# Patient Record
Sex: Female | Born: 1970 | Race: Black or African American | Hispanic: No | Marital: Single | Smoking: Never smoker
Health system: Southern US, Community
[De-identification: ages and names within clinical notes are randomized; demographics above are authoritative.]

## PROBLEM LIST (undated history)

## (undated) DIAGNOSIS — IMO0002 Reserved for concepts with insufficient information to code with codable children: Secondary | ICD-10-CM

## (undated) DIAGNOSIS — M329 Systemic lupus erythematosus, unspecified: Secondary | ICD-10-CM

## (undated) DIAGNOSIS — I10 Essential (primary) hypertension: Secondary | ICD-10-CM

## (undated) DIAGNOSIS — E118 Type 2 diabetes mellitus with unspecified complications: Secondary | ICD-10-CM

## (undated) DIAGNOSIS — E119 Type 2 diabetes mellitus without complications: Secondary | ICD-10-CM

## (undated) HISTORY — DX: Type 2 diabetes mellitus with unspecified complications: E11.8

## (undated) HISTORY — PX: NO PAST SURGERIES: SHX2092

---

## 1999-07-06 ENCOUNTER — Other Ambulatory Visit: Admission: RE | Admit: 1999-07-06 | Discharge: 1999-07-06 | Payer: Self-pay | Admitting: Obstetrics and Gynecology

## 2000-08-31 ENCOUNTER — Other Ambulatory Visit: Admission: RE | Admit: 2000-08-31 | Discharge: 2000-08-31 | Payer: Self-pay | Admitting: Obstetrics and Gynecology

## 2001-09-19 ENCOUNTER — Ambulatory Visit (HOSPITAL_COMMUNITY): Admission: RE | Admit: 2001-09-19 | Discharge: 2001-09-19 | Payer: Self-pay | Admitting: Internal Medicine

## 2001-09-20 ENCOUNTER — Ambulatory Visit (HOSPITAL_COMMUNITY): Admission: RE | Admit: 2001-09-20 | Discharge: 2001-09-20 | Payer: Self-pay | Admitting: Internal Medicine

## 2001-09-25 ENCOUNTER — Other Ambulatory Visit: Admission: RE | Admit: 2001-09-25 | Discharge: 2001-09-25 | Payer: Self-pay | Admitting: Obstetrics and Gynecology

## 2003-03-18 ENCOUNTER — Other Ambulatory Visit: Admission: RE | Admit: 2003-03-18 | Discharge: 2003-03-18 | Payer: Self-pay | Admitting: Obstetrics and Gynecology

## 2009-02-12 ENCOUNTER — Encounter: Admission: RE | Admit: 2009-02-12 | Discharge: 2009-02-12 | Payer: Self-pay | Admitting: Specialist

## 2009-04-01 ENCOUNTER — Encounter: Admission: RE | Admit: 2009-04-01 | Discharge: 2009-04-01 | Payer: Self-pay | Admitting: Specialist

## 2010-05-12 ENCOUNTER — Other Ambulatory Visit: Payer: Self-pay | Admitting: Specialist

## 2010-05-12 ENCOUNTER — Ambulatory Visit
Admission: RE | Admit: 2010-05-12 | Discharge: 2010-05-12 | Disposition: A | Discharge status: Home / Self Care | Payer: 59 | Source: Ambulatory Visit | Attending: Specialist | Admitting: Specialist

## 2010-05-12 DIAGNOSIS — R05 Cough: Secondary | ICD-10-CM

## 2010-10-30 ENCOUNTER — Emergency Department (HOSPITAL_BASED_OUTPATIENT_CLINIC_OR_DEPARTMENT_OTHER)
Admission: EM | Admit: 2010-10-30 | Discharge: 2010-10-30 | Disposition: A | Discharge status: Home / Self Care | Payer: 59 | Attending: Emergency Medicine | Admitting: Emergency Medicine

## 2010-10-30 ENCOUNTER — Encounter: Payer: Self-pay | Admitting: Emergency Medicine

## 2010-10-30 ENCOUNTER — Other Ambulatory Visit: Payer: Self-pay

## 2010-10-30 DIAGNOSIS — R42 Dizziness and giddiness: Secondary | ICD-10-CM | POA: Insufficient documentation

## 2010-10-30 DIAGNOSIS — E860 Dehydration: Secondary | ICD-10-CM | POA: Insufficient documentation

## 2010-10-30 DIAGNOSIS — R55 Syncope and collapse: Secondary | ICD-10-CM

## 2010-10-30 HISTORY — DX: Essential (primary) hypertension: I10

## 2010-10-30 HISTORY — DX: Systemic lupus erythematosus, unspecified: M32.9

## 2010-10-30 HISTORY — DX: Reserved for concepts with insufficient information to code with codable children: IMO0002

## 2010-10-30 LAB — URINALYSIS, ROUTINE W REFLEX MICROSCOPIC
Bilirubin Urine: NEGATIVE
Glucose, UA: NEGATIVE mg/dL
Hgb urine dipstick: NEGATIVE
Protein, ur: NEGATIVE mg/dL
Urobilinogen, UA: 1 mg/dL (ref 0.0–1.0)

## 2010-10-30 MED ORDER — SODIUM CHLORIDE 0.9 % IV BOLUS (SEPSIS): 1000.0000 mL | Freq: Once | INTRAVENOUS | Status: DC

## 2010-10-30 NOTE — ED Notes (Signed)
Pt verbalizes care plan well and need for increased hydration

## 2010-10-30 NOTE — ED Provider Notes (Signed)
Scribed for Dayton Bailiff, MD, the patient was seen in room [facility]2/[facility] . This chart was scribed by Ellie Lunch. This patient's care was started at 3:30 PM.   CSN: [id number] Arrival date & time: 10/30/2010  3:45 PM   Chief Complaint  Patient presents with  . Dizziness    pt reports episode of dizziness after 2 glasses of wine No LOC     (Include location/radiation/quality/duration/timing/severity/associated sxs/prior treatment) HPI Andrea [name] is a 40 y.o. female who presents to the Emergency Department complaining of a near syncopal event after a wine tasting event at an outdoor farmers market about 2 hours ago. Pt reports she began to feel light headed (now resolved), sat down. No LOC. Denies HA, N/V/D, chest pain, abd pain, vision change.   HPI ELEMENTS:  Onset: ~ 2 hours ago, sudden Duration: 1 episode  Timing: constant  Context: as above  Modifying factors: Sx improved by change in position- standing to sitting   Past Medical History  Diagnosis Date  . Hypertension   . Anxiety disorder   . Lupus     History reviewed. No pertinent past surgical history.  No family history on file.  History  Substance Use Topics  . Smoking status: Never Smoker   . Smokeless tobacco: Not on file  . Alcohol Use: 1.2 oz/week    2 Glasses of wine per week     Review of Systems 10 Systems reviewed and are negative for acute change except as noted in the HPI.   Allergies  Review of patient's allergies indicates no known allergies.  Home Medications   Current Outpatient Rx  Name Route Sig Dispense Refill  . ALPRAZOLAM 0.25 MG PO TABS Oral Take 0.25 mg by mouth at bedtime as needed.      Marland Kitchen HYDROCHLOROTHIAZIDE 12.5 MG PO CAPS Oral Take 12.5 mg by mouth daily.      Marland Kitchen METFORMIN HCL 500 MG (MOD) PO TB24 Oral Take 500 mg by mouth daily with breakfast.      . METOPROLOL TARTRATE 50 MG PO TABS Oral Take 50 mg by mouth 2 (two) times daily.        Physical Exam    BP 115/66   Pulse 116  Temp(Src) 98.7 F (37.1 C) (Oral)  Resp 24  SpO2 100%  LMP 09/29/2010  Physical Exam  Nursing note and vitals reviewed. Constitutional: She is oriented to person, place, and time. She appears well-developed and well-nourished.  HENT:  Head: Normocephalic and atraumatic.  Eyes: Conjunctivae and EOM are normal. Pupils are equal, round, and reactive to light.  Neck: Normal range of motion. Neck supple.  Cardiovascular: Regular rhythm and normal heart sounds.        Mild tachycardia  Pulmonary/Chest: Effort normal and breath sounds normal.  Abdominal: Soft. Bowel sounds are normal.  Musculoskeletal: Normal range of motion.  Neurological: She is alert and oriented to person, place, and time.  Skin: Skin is warm and dry.  Psychiatric: She has a normal mood and affect.   Procedures OTHER DATA REVIEWED: Nursing notes, vital signs, and past medical records reviewed.  DIAGNOSTIC STUDIES: Oxygen Saturation is 100% on room air, normal by my interpretation.    LABS / RADIOLOGY:  Results for orders placed during the hospital encounter of 10/30/10  URINALYSIS, ROUTINE W REFLEX MICROSCOPIC      Component Value Range   Color, Urine YELLOW  YELLOW    Appearance CLEAR  CLEAR    Specific Gravity, Urine 1.018  1.005 -  1.030    pH 6.0  5.0 - 8.0    Glucose, UA NEGATIVE  NEGATIVE (mg/dL)   Hgb urine dipstick NEGATIVE  NEGATIVE    Bilirubin Urine NEGATIVE  NEGATIVE    Ketones, ur 15 (*) NEGATIVE (mg/dL)   Protein, ur NEGATIVE  NEGATIVE (mg/dL)   Urobilinogen, UA 1.0  0.0 - 1.0 (mg/dL)   Nitrite NEGATIVE  NEGATIVE    Leukocytes, UA NEGATIVE  NEGATIVE     ED COURSE / COORDINATION OF CARE: EDP at PT bedside. Discussed with PT: plan to treat with fluids, run labs, observe Pt for improvement, and discharge.  Orders Placed This Encounter  Procedures  . Urinalysis with microscopic  . Orthostatic vital signs  . EKG test   Medications  sodium chloride 0.9 % bolus 1,000 mL (not  administered)    Date: 10/30/2010  Rate: 101  Rhythm: sinus tachycardia  QRS Axis: normal  Intervals: normal  ST/T Wave abnormalities: normal  Conduction Disutrbances:none  Narrative Interpretation: unremarkable ecg  Old EKG Reviewed: none available  5:53 PM On reassessment the patient has improvement of her symptoms to the resolution. She has negative orthostatic. Her heart rate normalized. At this time patient is safe for discharge.  MDM: The patient's near syncope secondary to dehydration and alcohol consumption in the weather elements. Her urine shows small amount of ketones. She received 1.5 L of IV fluids with resolution of her symptoms. Patient has no signs of orthostasis. I encouraged patient to continue aggressive oral rehydration at home with water and Gatorade. I instructed her to refrain from juices, soft drinks, alcohol. She is provided signs and symptoms for which to return the emergency department. I'm not concerned about cardiac cause of syncope. She instructed to followup with her primary care physician this week as needed  IMPRESSION: Diagnoses that have been ruled out:  Diagnoses that are still under consideration:  Final diagnoses:   SCRIBE ATTESTATION: I personally performed the services described in this documentation, which was scribed in my presence. The recorded information has been reviewed and considered.          Dayton Bailiff, MD 10/30/10 1755

## 2010-10-30 NOTE — ED Notes (Signed)
Pt reports "being overheated" while at Wine tasting reports 1-2 glasses on wine only arrived with IVF infusion in progress

## 2011-08-15 ENCOUNTER — Other Ambulatory Visit: Payer: Self-pay | Admitting: Specialist

## 2011-08-15 ENCOUNTER — Ambulatory Visit
Admission: RE | Admit: 2011-08-15 | Discharge: 2011-08-15 | Disposition: A | Discharge status: Home / Self Care | Payer: 59 | Source: Ambulatory Visit | Attending: Specialist | Admitting: Specialist

## 2011-08-15 DIAGNOSIS — R05 Cough: Secondary | ICD-10-CM

## 2011-12-18 ENCOUNTER — Encounter (HOSPITAL_COMMUNITY): Payer: Self-pay | Admitting: *Deleted

## 2011-12-18 ENCOUNTER — Emergency Department (HOSPITAL_COMMUNITY): Payer: 59

## 2011-12-18 ENCOUNTER — Inpatient Hospital Stay (HOSPITAL_COMMUNITY)
Admission: EM | Admit: 2011-12-18 | Discharge: 2011-12-20 | DRG: 195 | Disposition: A | Discharge status: Home / Self Care | Payer: 59 | Attending: Internal Medicine | Admitting: Internal Medicine

## 2011-12-18 DIAGNOSIS — I10 Essential (primary) hypertension: Secondary | ICD-10-CM | POA: Diagnosis present

## 2011-12-18 DIAGNOSIS — M329 Systemic lupus erythematosus, unspecified: Secondary | ICD-10-CM | POA: Diagnosis present

## 2011-12-18 DIAGNOSIS — E1321 Other specified diabetes mellitus with diabetic nephropathy: Secondary | ICD-10-CM | POA: Diagnosis present

## 2011-12-18 DIAGNOSIS — F411 Generalized anxiety disorder: Secondary | ICD-10-CM | POA: Diagnosis present

## 2011-12-18 DIAGNOSIS — D72829 Elevated white blood cell count, unspecified: Secondary | ICD-10-CM | POA: Diagnosis present

## 2011-12-18 DIAGNOSIS — J189 Pneumonia, unspecified organism: Principal | ICD-10-CM | POA: Diagnosis present

## 2011-12-18 DIAGNOSIS — E119 Type 2 diabetes mellitus without complications: Secondary | ICD-10-CM | POA: Diagnosis present

## 2011-12-18 HISTORY — DX: Type 2 diabetes mellitus without complications: E11.9

## 2011-12-18 LAB — HEMOGLOBIN A1C: Mean Plasma Glucose: 163 mg/dL — ABNORMAL HIGH (ref <117)

## 2011-12-18 LAB — CBC WITH DIFFERENTIAL/PLATELET
Basophils Absolute: 0 10*3/uL (ref 0.0–0.1)
Eosinophils Absolute: 0.1 10*3/uL (ref 0.0–0.7)
Hemoglobin: 13.1 g/dL (ref 12.0–15.0)
MCHC: 33.2 g/dL (ref 30.0–36.0)
Neutro Abs: 8.2 10*3/uL — ABNORMAL HIGH (ref 1.7–7.7)
Neutrophils Relative %: 71 % (ref 43–77)
RDW: 14.8 % (ref 11.5–15.5)

## 2011-12-18 LAB — BASIC METABOLIC PANEL
BUN: 9 mg/dL (ref 6–23)
CO2: 23 mEq/L (ref 19–32)
GFR calc Af Amer: >90 mL/min (ref >90)
Glucose, Bld: 159 mg/dL — ABNORMAL HIGH (ref 70–99)
Sodium: 134 mEq/L — ABNORMAL LOW (ref 135–145)

## 2011-12-18 MED ORDER — SODIUM CHLORIDE 0.9 % IV BOLUS (SEPSIS)
250.0000 mL | Freq: Once | INTRAVENOUS | Status: AC
  Administered 2011-12-18: 250 mL via INTRAVENOUS

## 2011-12-18 MED ORDER — METFORMIN HCL ER 500 MG PO TB24
500.0000 mg | ORAL_TABLET | Freq: Every day | ORAL | Status: DC
  Administered 2011-12-19: 500 mg via ORAL
  Filled 2011-12-18 (×2): qty 1

## 2011-12-18 MED ORDER — IOHEXOL 350 MG/ML SOLN
100.0000 mL | Freq: Once | INTRAVENOUS | Status: AC | PRN
  Administered 2011-12-18: 100 mL via INTRAVENOUS

## 2011-12-18 MED ORDER — ONDANSETRON HCL 4 MG PO TABS: 4.0000 mg | ORAL_TABLET | Freq: Four times a day (QID) | ORAL | Status: DC | PRN

## 2011-12-18 MED ORDER — ENOXAPARIN SODIUM 60 MG/0.6ML ~~LOC~~ SOLN
50.0000 mg | SUBCUTANEOUS | Status: DC
  Administered 2011-12-18 – 2011-12-19 (×2): 50 mg via SUBCUTANEOUS
  Filled 2011-12-18 (×3): qty 0.6

## 2011-12-18 MED ORDER — METHYLPREDNISOLONE SODIUM SUCC 125 MG IJ SOLR
60.0000 mg | Freq: Two times a day (BID) | INTRAMUSCULAR | Status: DC
  Administered 2011-12-18 – 2011-12-19 (×2): 60 mg via INTRAVENOUS
  Filled 2011-12-18 (×2): qty 0.96
  Filled 2011-12-18: qty 2
  Filled 2011-12-18: qty 0.96

## 2011-12-18 MED ORDER — METHYLPREDNISOLONE SODIUM SUCC 125 MG IJ SOLR
125.0000 mg | Freq: Once | INTRAMUSCULAR | Status: AC
  Administered 2011-12-18: 125 mg via INTRAVENOUS
  Filled 2011-12-18: qty 2

## 2011-12-18 MED ORDER — LEVOFLOXACIN IN D5W 750 MG/150ML IV SOLN
750.0000 mg | INTRAVENOUS | Status: DC
  Administered 2011-12-18: 750 mg via INTRAVENOUS
  Filled 2011-12-18: qty 150

## 2011-12-18 MED ORDER — SODIUM CHLORIDE 0.9 % IV SOLN
INTRAVENOUS | Status: DC
  Administered 2011-12-18: 17:00:00 via INTRAVENOUS
  Administered 2011-12-19: 125 mL/h via INTRAVENOUS

## 2011-12-18 MED ORDER — HYDROMORPHONE HCL PF 1 MG/ML IJ SOLN: 1.0000 mg | INTRAMUSCULAR | Status: DC | PRN

## 2011-12-18 MED ORDER — GUAIFENESIN ER 600 MG PO TB12
600.0000 mg | ORAL_TABLET | Freq: Two times a day (BID) | ORAL | Status: DC
  Administered 2011-12-18 – 2011-12-20 (×4): 600 mg via ORAL
  Filled 2011-12-18 (×5): qty 1

## 2011-12-18 MED ORDER — SODIUM CHLORIDE 0.9 % IJ SOLN
3.0000 mL | Freq: Two times a day (BID) | INTRAMUSCULAR | Status: DC
  Administered 2011-12-19: 3 mL via INTRAVENOUS

## 2011-12-18 MED ORDER — SODIUM CHLORIDE 0.9 % IV SOLN: INTRAVENOUS | Status: DC

## 2011-12-18 MED ORDER — SODIUM CHLORIDE 0.9 % IV SOLN
INTRAVENOUS | Status: DC
  Administered 2011-12-18: 16:00:00 via INTRAVENOUS

## 2011-12-18 MED ORDER — ALBUTEROL SULFATE (5 MG/ML) 0.5% IN NEBU: 2.5000 mg | INHALATION_SOLUTION | RESPIRATORY_TRACT | Status: DC | PRN

## 2011-12-18 MED ORDER — ONDANSETRON HCL 4 MG/2ML IJ SOLN: 4.0000 mg | Freq: Four times a day (QID) | INTRAMUSCULAR | Status: DC | PRN

## 2011-12-18 MED ORDER — ALPRAZOLAM 0.25 MG PO TABS: 0.2500 mg | ORAL_TABLET | Freq: Three times a day (TID) | ORAL | Status: DC | PRN

## 2011-12-18 MED ORDER — METOPROLOL TARTRATE 50 MG PO TABS
50.0000 mg | ORAL_TABLET | Freq: Two times a day (BID) | ORAL | Status: DC
  Administered 2011-12-18 – 2011-12-20 (×4): 50 mg via ORAL
  Filled 2011-12-18 (×5): qty 1

## 2011-12-18 MED ORDER — HYDROCODONE-ACETAMINOPHEN 5-325 MG PO TABS
1.0000 | ORAL_TABLET | ORAL | Status: DC | PRN
  Administered 2011-12-19 – 2011-12-20 (×2): 1 via ORAL
  Filled 2011-12-18 (×2): qty 1

## 2011-12-18 NOTE — Progress Notes (Signed)
ANTIBIOTIC CONSULT NOTE - INITIAL  Pharmacy Consult for Levaquin Indication: rule out pneumonia  No Known Allergies  Patient Measurements: Height: 5\' 6"  (167.6 cm) Weight: 245 lb (111.131 kg) IBW/kg (Calculated) : 59.3   Vital Signs: Temp: 99.3 F (37.4 C) (11/03 1507) Temp src: Oral (11/03 1507) BP: 136/99 mmHg (11/03 1500) Pulse Rate: 116  (11/03 1500) Intake/Output from previous day:    Labs:  [facility] 12/18/11 1132  WBC 11.6*  HGB 13.1  PLT 230  LABCREA --  CREATININE 0.69   Estimated Creatinine Clearance: 114.5 ml/min (by C-G formula based on Cr of 0.69).   Microbiology: No results found for this or any previous visit (from the past 720 hour(s)).  Medical History: Past Medical History  Diagnosis Date  . Hypertension   . Anxiety disorder   . Lupus   . Diabetes mellitus without complication     Medications:  Anti-infectives    None     Assessment:  41 YOF prevents to ED 11/3 with c/o SOB. Hx of lupus, states has been having bronchitis for 5-6 months, received flu and pneumo vaccines recently.  CrCl (n) > 100 ml/min  Afebrile in ED, WBC elevated at 11.6  Goal of Therapy:  Appropriate renal dosing of levaquin, resolution of infection  Plan:   Levaquin 750mg  IV q24h.  Follow up renal fxn and culture results.   Lynann Beaver PharmD, BCPS Pager [phone] 12/18/2011 4:50 PM

## 2011-12-18 NOTE — ED Notes (Signed)
Patient transported to CT 

## 2011-12-18 NOTE — H&P (Signed)
[facility] History and Physical  Andrea [name] ION:[id number] DOB: [birth year] DOA: 12/18/2011  Referring physician: ER physician PCP: No primary provider on file.   Chief Complaint: shortness of breath  HPI:  41 year old female with past medical history of SLE, depression, HTN, DM who presented with worsening shortness of breath for past few days prior to this admission. Patient does report having chronic bronchitis and cough however this time she has had shortness of breath and feeling weak. She has been seen in the lupus clinic in [facility] 2 days prior were she was given a flu shot and recommended to taper down her prednisone to 10 mg twice daily. Patient reports associated subjective fever but no chest pain, no palpitations. She does have occasional lightheadedness but no loss of consciousness. In ED patient was found to be hypoxic and with leukocytosis.  Assessment and Plan:  Principal Problem:  *CAP (community acquired pneumonia)  Pneumonia order set in place  Will follow up on labs associated with pneumonia order set (blood and resp cultures, legionella and strep pneumo)  Start Levaquin IV daily  Nebulizer treatments as needed  Oxygen support via nasal canula to keep O2 saturation above 90%  Active Problems: Shortness of breath  Obvious concern in the setting of desaturation is likelihood of pulmonary embolism  Patient had CT angio chest which was negative for PE but findings included interstitial changes consistent with the patient's history of lupus  Leukocytosis  Secondary to combination of steroids and community acquired pneumonia  Management as above with Levaquin  HTN (hypertension)  Continue metoprolol  Diabetes mellitus  Continue metformin  Check A1c  Lupus  Will switch to Iv solumedrol and D/C home prednisone PO dosage Anxiety  Ativan TID PRN per home regimen   Code status: full code Family education: updated at  bedside Disposition: home when stable   Manson Passey [facility] [phone]  Antibiotics:  Levaquin 12/18/2011 -->  Review of Systems:  Constitutional: Negative for fever, chills and malaise/fatigue. Negative for diaphoresis.  HENT: Negative for hearing loss, ear pain, nosebleeds, congestion, sore throat, neck pain, tinnitus and ear discharge.   Eyes: Negative for blurred vision, double vision, photophobia, pain, discharge and redness.  Respiratory: Negative for cough, hemoptysis, sputum production, positive for shortness of breath, no wheezing and stridor.   Cardiovascular: Negative for chest pain, palpitations, orthopnea, claudication and leg swelling.  Gastrointestinal: Negative for nausea, vomiting and abdominal pain. Negative for heartburn, constipation, blood in stool and melena.  Genitourinary: Negative for dysuria, urgency, frequency, hematuria and flank pain.  Musculoskeletal: Negative for myalgias, back pain, joint pain and falls.  Skin: Negative for itching and rash.  Neurological: Negative for dizziness and weakness. Negative for tingling, tremors, sensory change, speech change, focal weakness, loss of consciousness and headaches.  Endo/Heme/Allergies: Negative for environmental allergies and polydipsia. Does not bruise/bleed easily.  Psychiatric/Behavioral: Negative for suicidal ideas. The patient is not nervous/anxious.      Past Medical History  Diagnosis Date  . Hypertension   . Anxiety disorder   . Lupus   . Diabetes mellitus without complication    History reviewed. No pertinent past surgical history. Social History:  reports that she has never smoked. She does not have any smokeless tobacco history on file. She reports that she drinks about 1.2 ounces of alcohol per week. She reports that she does not use illicit drugs.  No Known Allergies  Family History: HTN in mother  Prior to Admission medications   Medication Sig  Start Date End Date Taking? Authorizing Provider   ALPRAZolam (XANAX) 0.25 MG tablet Take 0.25 mg by mouth 3 (three) times daily as needed. anxiety   Yes Historical Provider, MD  hydroxychloroquine (PLAQUENIL) 200 MG tablet Take by mouth daily.   Yes Historical Provider, MD  metFORMIN (GLUMETZA) 500 MG (MOD) 24 hr tablet Take 500 mg by mouth daily with breakfast.     Yes Historical Provider, MD  metoprolol (LOPRESSOR) 50 MG tablet Take 50 mg by mouth 2 (two) times daily.     Yes Historical Provider, MD  predniSONE (DELTASONE) 10 MG tablet Take 10-20 mg by mouth 2 (two) times daily. Take 2 tablets in the morning and 1 tablet in the pm   Yes Historical Provider, MD   Physical Exam: Filed Vitals:   12/18/11 1433 12/18/11 1500 12/18/11 1507 12/18/11 1652  BP: 109/86 136/99  129/90  Pulse: 122 116  112  Temp:   99.3 F (37.4 C) 98.4 F (36.9 C)  TempSrc:   Oral Oral  Resp:  31  26  Height:      Weight:      SpO2: 99% 99%  98%    Physical Exam  Constitutional: Appears well-developed and well-nourished. No distress.  HENT: Normocephalic. External right and left ear normal. Oropharynx is clear and moist.  Eyes: Conjunctivae and EOM are normal. PERRLA, no scleral icterus.  Neck: Normal ROM. Neck supple. No JVD. No tracheal deviation. No thyromegaly.  CVS: RRR, S1/S2 +, no murmurs, no gallops, no carotid bruit.  Pulmonary: diminished breath sounds at bases, crackles appreciated at bases.  Abdominal: Soft. BS +,  no distension, tenderness, rebound or guarding.  Musculoskeletal: Normal range of motion. No edema and no tenderness.  Lymphadenopathy: No lymphadenopathy noted, cervical, inguinal. Neuro: Alert. Normal reflexes, muscle tone coordination. No cranial nerve deficit. Skin: Skin is warm and dry. No rash noted. Not diaphoretic. No erythema. No pallor.  Psychiatric: Normal mood and affect. Behavior, judgment, thought content normal.   Labs on Admission:  Basic Metabolic Panel:  Lab 12/18/11 1610  NA 134*  K 3.6  CL 99  CO2 23   GLUCOSE 159*  BUN 9  CREATININE 0.69  CALCIUM 9.4   CBC:  Lab 12/18/11 1132  WBC 11.6*  HGB 13.1  HCT 39.4  MCV 81.6  PLT 230   Radiological Exams on Admission: Dg Chest 2 View 12/18/2011  * IMPRESSION:  1.  Bibasilar atelectasis. 2.  Bronchitic changes.     Ct Angio Chest W/cm &/or Wo Cm 12/18/2011  *  IMPRESSION: Negative for significant acute pulmonary embolus by CTA within the central and proximal hilar vasculature.  Scattered patchy bilateral interstitial opacities, suspect chronic interstitial lung disease from connective tissue disease such as a lupus.       EKG: Normal sinus rhythm, no ST/T wave changes  Time spent: 75 minutes

## 2011-12-18 NOTE — ED Notes (Signed)
Pt has hx of lupus states has been having bronchitis for 5-6 months. Pt went to lupus clinic on Friday and received the flu shot. Pt states that SOB has worsened today. Pt also c/o HA.

## 2011-12-18 NOTE — ED Provider Notes (Signed)
History     CSN: [id number]  Arrival date & time 12/18/11  1059   First MD Initiated Contact with Patient 12/18/11 1140      Chief Complaint  Patient presents with  . Shortness of Breath    (Consider location/radiation/quality/duration/timing/severity/associated sxs/prior treatment) Patient is a 41 y.o. female presenting with shortness of breath. The history is provided by the patient and a relative.  Shortness of Breath  Associated symptoms include shortness of breath. Pertinent negatives include no chest pain, no fever and no wheezing.   patient with a history of lupus has been having trouble with chronic bronchitis for the past 5-6 months. Was seen at [facility] Lupus clinic on Friday. They decreased her prednisone to 10 mg twice a day. She also got a flu shot and pneumonia shot on that day. Starting that evening she started to feel bad increase shortness of breath got worse over the weekend. Patient now with walking gets lightheaded and feels very short of breath.  Past Medical History  Diagnosis Date  . Hypertension   . Anxiety disorder   . Lupus   . Diabetes mellitus without complication     History reviewed. No pertinent past surgical history.  History reviewed. No pertinent family history.  History  Substance Use Topics  . Smoking status: Never Smoker   . Smokeless tobacco: Not on file  . Alcohol Use: 1.2 oz/week    2 Glasses of wine per week    OB History    Grav Para Term Preterm Abortions TAB SAB Ect Mult Living                  Review of Systems  Constitutional: Negative for fever.  HENT: Negative for congestion.   Eyes: Negative for visual disturbance.  Respiratory: Positive for shortness of breath. Negative for wheezing.   Cardiovascular: Negative for chest pain.  Gastrointestinal: Negative for abdominal pain.  Genitourinary: Negative for dysuria.  Musculoskeletal: Negative for back pain.  Skin: Negative for rash.  Neurological: Positive for  light-headedness.    Allergies  Review of patient's allergies indicates no known allergies.  Home Medications   Current Outpatient Rx  Name  Route  Sig  Dispense  Refill  . ALPRAZOLAM 0.25 MG PO TABS   Oral   Take 0.25 mg by mouth 3 (three) times daily as needed. anxiety         . HYDROXYCHLOROQUINE SULFATE 200 MG PO TABS   Oral   Take by mouth daily.         Marland Kitchen METFORMIN HCL ER (MOD) 500 MG PO TB24   Oral   Take 500 mg by mouth daily with breakfast.           . METOPROLOL TARTRATE 50 MG PO TABS   Oral   Take 50 mg by mouth 2 (two) times daily.           Marland Kitchen PREDNISONE 10 MG PO TABS   Oral   Take 10-20 mg by mouth 2 (two) times daily. Take 2 tablets in the morning and 1 tablet in the pm           BP 136/99  Pulse 116  Temp 99.3 F (37.4 C) (Oral)  Resp 31  Ht 5\' 6"  (1.676 m)  Wt 245 lb (111.131 kg)  BMI 39.54 kg/m2  SpO2 99%  Physical Exam  Nursing note and vitals reviewed. Constitutional: She is oriented to person, place, and time. She appears well-developed and well-nourished. No  distress.  HENT:  Head: Normocephalic and atraumatic.  Mouth/Throat: Oropharynx is clear and moist.  Eyes: Conjunctivae normal and EOM are normal. Pupils are equal, round, and reactive to light.  Neck: Normal range of motion. Neck supple.  Cardiovascular: Normal rate, regular rhythm and normal heart sounds.   Pulmonary/Chest: Effort normal and breath sounds normal. No respiratory distress. She has no wheezes. She has no rales.  Abdominal: Soft. Bowel sounds are normal. There is no tenderness.  Musculoskeletal: Normal range of motion.  Neurological: She is alert and oriented to person, place, and time. No cranial nerve deficit. She exhibits normal muscle tone. Coordination normal.  Skin: Skin is warm. No rash noted.    ED Course  Procedures (including critical care time)  Labs Reviewed  CBC WITH DIFFERENTIAL - Abnormal; Notable for the following:    WBC 11.6 (*)      Neutro Abs 8.2 (*)     All other components within normal limits  BASIC METABOLIC PANEL - Abnormal; Notable for the following:    Sodium 134 (*)     Glucose, Bld 159 (*)     All other components within normal limits   Dg Chest 2 View  12/18/2011  *RADIOLOGY REPORT*  Clinical Data: Shortness of breath and cough  CHEST - 2 VIEW  Comparison: 08/15/2011  Findings: There is mild cardiac enlargement.  Lung volumes are low and there is asymmetric elevation of the right hemidiaphragm.  Moderate bronchitic changes are noted with atelectasis in both lung bases.  No edema or effusions.  No airspace consolidation.  IMPRESSION:  1.  Bibasilar atelectasis. 2.  Bronchitic changes.   Original Report Authenticated By: Signa Kell, M.D.    Ct Angio Chest W/cm &/or Wo Cm  12/18/2011  *RADIOLOGY REPORT*  Clinical Data: Shortness of breath, history of lupus and chronic bronchitis  CT ANGIOGRAPHY CHEST  Technique:  Multidetector CT imaging of the chest using the standard protocol during bolus administration of intravenous contrast. Multiplanar reconstructed images including MIPs were obtained and reviewed to evaluate the vascular anatomy.  Contrast: OMNIPAQUE IOHEXOL 350 MG/ML SOLN  Comparison: 08/17/2011 chest x-ray  Findings: No significant central or proximal hilar filling defect or pulmonary embolus by CTA.  Limited assessment of the peripheral segmental and subsegmental branching vasculature for small emboli. Normal heart size.  No pericardial or pleural effusion.  No hiatal hernia.  Mildly prominent mediastinal lymph nodes without definite adenopathy.  Upper abdominal imaging demonstrates no acute process.  Lung windows demonstrate patchy scattered peripheral chronic appearing interstitial opacities in the upper lobes anteriorly but also in the lingula, right middle lobe, and both lower lobes. Within the right middle lobe and both lower lobes, there are central air bronchograms.  The pattern and appearance is  compatible with chronic interstitial lung disease which can be seen with lupus.  Difficult to exclude underlying acute pulmonary hemorrhage or infection.  No associated bronchiectasis, mucus plugging, bronchial wall thickening.  Trachea central airways appear patent.  No osseous abnormality.  IMPRESSION: Negative for significant acute pulmonary embolus by CTA within the central and proximal hilar vasculature.  Scattered patchy bilateral interstitial opacities, suspect chronic interstitial lung disease from connective tissue disease such as a lupus.  See above comment.   Original Report Authenticated By: Judie Petit. Miles Costain, M.D.    Results for orders placed during the hospital encounter of 12/18/11  CBC WITH DIFFERENTIAL      Component Value Range   WBC 11.6 (*) 4.0 - 10.5 K/uL  RBC 4.83  3.87 - 5.11 MIL/uL   Hemoglobin 13.1  12.0 - 15.0 g/dL   HCT 16.1  09.6 - 04.5 %   MCV 81.6  78.0 - 100.0 fL   MCH 27.1  26.0 - 34.0 pg   MCHC 33.2  30.0 - 36.0 g/dL   RDW 40.9  81.1 - 91.4 %   Platelets 230  150 - 400 K/uL   Neutrophils Relative 71  43 - 77 %   Neutro Abs 8.2 (*) 1.7 - 7.7 K/uL   Lymphocytes Relative 21  12 - 46 %   Lymphs Abs 2.4  0.7 - 4.0 K/uL   Monocytes Relative 8  3 - 12 %   Monocytes Absolute 0.9  0.1 - 1.0 K/uL   Eosinophils Relative 1  0 - 5 %   Eosinophils Absolute 0.1  0.0 - 0.7 K/uL   Basophils Relative 0  0 - 1 %   Basophils Absolute 0.0  0.0 - 0.1 K/uL  BASIC METABOLIC PANEL      Component Value Range   Sodium 134 (*) 135 - 145 mEq/L   Potassium 3.6  3.5 - 5.1 mEq/L   Chloride 99  96 - 112 mEq/L   CO2 23  19 - 32 mEq/L   Glucose, Bld 159 (*) 70 - 99 mg/dL   BUN 9  6 - 23 mg/dL   Creatinine, Ser 7.82  0.50 - 1.10 mg/dL   Calcium 9.4  8.4 - 95.6 mg/dL   GFR calc non Af Amer >90  >90 mL/min   GFR calc Af Amer >90  >90 mL/min     1. Lupus (systemic lupus erythematosus)       MDM  Patient with known lupus presented with worsening shortness of breath since Friday evening.  Workup seems to be consistent with an exacerbation of her lupus. No evidence of pneumonia PE or pneumothorax. No significant leukocytosis. Patient is chronically on prednisone recently decreased on Friday. Patient here when she exerted self with desats below 90% room air saturation. Patient also feel lightheaded and the shortness of breath would increase. At rest room air saturations been 95-96%. The patient feels better on 2 L of oxygen. Patient received R. 25 mg site Medrol here in the emergency department.  Discussed with [facility] they will admit to telemetry team 1 temporary middle orders have been completed. To include their request of Solu-Medrol 60 mg every 12 hours.        Shelda Jakes, MD 12/18/11 1534

## 2011-12-18 NOTE — ED Notes (Signed)
Pt wheeled to BR without 02, upon arrival to ER rm 6, 02 sats 83% on RA and pts HR 132. 02 at 2L placed on pt and sats increased to 100%. HR down to 123.

## 2011-12-18 NOTE — ED Notes (Signed)
MD at bedside. 

## 2011-12-18 NOTE — ED Notes (Signed)
Took pt a sandwich, chips, and a cup of water

## 2011-12-19 LAB — CBC
HCT: 36.7 % (ref 36.0–46.0)
Hemoglobin: 11.9 g/dL — ABNORMAL LOW (ref 12.0–15.0)
MCHC: 32.4 g/dL (ref 30.0–36.0)
MCV: 82.1 fL (ref 78.0–100.0)
WBC: 9.5 10*3/uL (ref 4.0–10.5)

## 2011-12-19 LAB — HIV ANTIBODY (ROUTINE TESTING W REFLEX): HIV: NONREACTIVE

## 2011-12-19 LAB — GLUCOSE, CAPILLARY
Glucose-Capillary: 218 mg/dL — ABNORMAL HIGH (ref 70–99)
Glucose-Capillary: 180 mg/dL — ABNORMAL HIGH (ref 70–99)
Glucose-Capillary: 137 mg/dL — ABNORMAL HIGH (ref 70–99)

## 2011-12-19 LAB — BASIC METABOLIC PANEL
BUN: 10 mg/dL (ref 6–23)
CO2: 22 mEq/L (ref 19–32)
Chloride: 103 mEq/L (ref 96–112)
GFR calc Af Amer: >90 mL/min (ref >90)
Potassium: 4.5 mEq/L (ref 3.5–5.1)

## 2011-12-19 MED ORDER — HYDROXYCHLOROQUINE SULFATE 200 MG PO TABS
200.0000 mg | ORAL_TABLET | Freq: Every day | ORAL | Status: DC
  Administered 2011-12-19 – 2011-12-20 (×2): 200 mg via ORAL
  Filled 2011-12-19 (×2): qty 1

## 2011-12-19 MED ORDER — LEVOFLOXACIN 750 MG PO TABS
750.0000 mg | ORAL_TABLET | Freq: Every day | ORAL | Status: DC
  Administered 2011-12-19 – 2011-12-20 (×2): 750 mg via ORAL
  Filled 2011-12-19 (×2): qty 1

## 2011-12-19 MED ORDER — GUAIFENESIN-DM 100-10 MG/5ML PO SYRP
5.0000 mL | ORAL_SOLUTION | ORAL | Status: DC | PRN
Start: 2011-12-19 — End: 2011-12-20

## 2011-12-19 MED ORDER — INSULIN ASPART 100 UNIT/ML ~~LOC~~ SOLN
0.0000 [IU] | Freq: Every day | SUBCUTANEOUS | Status: DC
  Administered 2011-12-19: 2 [IU] via SUBCUTANEOUS

## 2011-12-19 MED ORDER — INSULIN ASPART 100 UNIT/ML ~~LOC~~ SOLN
0.0000 [IU] | Freq: Three times a day (TID) | SUBCUTANEOUS | Status: DC
  Administered 2011-12-19: 1 [IU] via SUBCUTANEOUS
  Administered 2011-12-19: 9 [IU] via SUBCUTANEOUS
  Administered 2011-12-20: 2 [IU] via SUBCUTANEOUS
  Administered 2011-12-20: 1 [IU] via SUBCUTANEOUS

## 2011-12-19 MED ORDER — PREDNISONE 20 MG PO TABS
20.0000 mg | ORAL_TABLET | Freq: Every day | ORAL | Status: DC
  Administered 2011-12-20: 20 mg via ORAL
  Filled 2011-12-19 (×2): qty 1

## 2011-12-19 MED ORDER — METFORMIN HCL ER 500 MG PO TB24
1000.0000 mg | ORAL_TABLET | Freq: Every day | ORAL | Status: DC
  Filled 2011-12-19: qty 2

## 2011-12-19 MED ORDER — PREDNISONE 10 MG PO TABS
10.0000 mg | ORAL_TABLET | Freq: Every day | ORAL | Status: DC
  Administered 2011-12-19: 10 mg via ORAL
  Filled 2011-12-19 (×2): qty 1

## 2011-12-19 NOTE — Progress Notes (Signed)
Patient wearing oxygen when she feels short of breath.  Otherwise, her pulse ox on room air is 95%.  Will continue to monitor.

## 2011-12-19 NOTE — Progress Notes (Signed)
Subjective: Feeling slightly better today but still coughing.  No other specific concerns.  Objective: Vital signs in last 24 hours: Filed Vitals:   12/18/11 1507 12/18/11 1652 12/18/11 2200 12/19/11 0600  BP:  129/90 135/83 120/84  Pulse:  112 110 86  Temp: 99.3 F (37.4 C) 98.4 F (36.9 C) 99.5 F (37.5 C) 97.5 F (36.4 C)  TempSrc: Oral Oral Oral Oral  Resp:  26 24 20   Height:      Weight:      SpO2:  98% 98% 100%   Weight change:   Intake/Output Summary (Last 24 hours) at 12/19/11 1116 Last data filed at 12/19/11 0500  Gross per 24 hour  Intake   1615 ml  Output      0 ml  Net   1615 ml    Physical Exam: General: Awake, Oriented, No acute distress. HEENT: EOMI. Neck: Supple CV: S1 and S2 Lungs: Clear to ascultation bilaterally Abdomen: Soft, Nontender, Nondistended, +bowel sounds. Ext: Good pulses. Trace edema.  Lab Results: Basic Metabolic Panel:  Lab 12/19/11 4098 12/18/11 1132  NA 132* 134*  K 4.5 3.6  CL 103 99  CO2 22 23  GLUCOSE 280* 159*  BUN 10 9  CREATININE 0.62 0.69  CALCIUM 8.9 9.4  MG -- --  PHOS -- --   Liver Function Tests: No results found for this basename: AST:5,ALT:5,ALKPHOS:5,BILITOT:5,PROT:5,ALBUMIN:5 in the last 168 hours No results found for this basename: LIPASE:5,AMYLASE:5 in the last 168 hours No results found for this basename: AMMONIA:5 in the last 168 hours CBC:  Lab 12/19/11 0512 12/18/11 1132  WBC 9.5 11.6*  NEUTROABS -- 8.2*  HGB 11.9* 13.1  HCT 36.7 39.4  MCV 82.1 81.6  PLT 228 230   Cardiac Enzymes: No results found for this basename: CKTOTAL:5,CKMB:5,CKMBINDEX:5,TROPONINI:5 in the last 168 hours BNP (last 3 results) No results found for this basename: PROBNP:3 in the last 8760 hours CBG: No results found for this basename: GLUCAP:5 in the last 168 hours  Basename 12/18/11 1132  HGBA1C 7.3*   Other Labs: No components found with this basename: POCBNP:3 No results found for this basename: DDIMER:2 in  the last 168 hours No results found for this basename: CHOL:2,HDL:2,LDLCALC:2,TRIG:2,CHOLHDL:2,LDLDIRECT:2 in the last 168 hours No results found for this basename: TSH,T4TOTAL,FREET3,T3FREE,FREET4,THYROIDAB in the last 168 hours No results found for this basename: VITAMINB12:2,FOLATE:2,FERRITIN:2,TIBC:2,IRON:2,RETICCTPCT:2 in the last 168 hours  Micro Results: No results found for this or any previous visit (from the past 240 hour(s)).  Studies/Results: Dg Chest 2 View  12/18/2011  *RADIOLOGY REPORT*  Clinical Data: Shortness of breath and cough  CHEST - 2 VIEW  Comparison: 08/15/2011  Findings: There is mild cardiac enlargement.  Lung volumes are low and there is asymmetric elevation of the right hemidiaphragm.  Moderate bronchitic changes are noted with atelectasis in both lung bases.  No edema or effusions.  No airspace consolidation.  IMPRESSION:  1.  Bibasilar atelectasis. 2.  Bronchitic changes.   Original Report Authenticated By: Signa Kell, M.D.    Ct Angio Chest W/cm &/or Wo Cm  12/18/2011  *RADIOLOGY REPORT*  Clinical Data: Shortness of breath, history of lupus and chronic bronchitis  CT ANGIOGRAPHY CHEST  Technique:  Multidetector CT imaging of the chest using the standard protocol during bolus administration of intravenous contrast. Multiplanar reconstructed images including MIPs were obtained and reviewed to evaluate the vascular anatomy.  Contrast: OMNIPAQUE IOHEXOL 350 MG/ML SOLN  Comparison: 08/17/2011 chest x-ray  Findings: No significant central or proximal  hilar filling defect or pulmonary embolus by CTA.  Limited assessment of the peripheral segmental and subsegmental branching vasculature for small emboli. Normal heart size.  No pericardial or pleural effusion.  No hiatal hernia.  Mildly prominent mediastinal lymph nodes without definite adenopathy.  Upper abdominal imaging demonstrates no acute process.  Lung windows demonstrate patchy scattered peripheral chronic  appearing interstitial opacities in the upper lobes anteriorly but also in the lingula, right middle lobe, and both lower lobes. Within the right middle lobe and both lower lobes, there are central air bronchograms.  The pattern and appearance is compatible with chronic interstitial lung disease which can be seen with lupus.  Difficult to exclude underlying acute pulmonary hemorrhage or infection.  No associated bronchiectasis, mucus plugging, bronchial wall thickening.  Trachea central airways appear patent.  No osseous abnormality.  IMPRESSION: Negative for significant acute pulmonary embolus by CTA within the central and proximal hilar vasculature.  Scattered patchy bilateral interstitial opacities, suspect chronic interstitial lung disease from connective tissue disease such as a lupus.  See above comment.   Original Report Authenticated By: Judie Petit. Miles Costain, M.D.     Medications: I have reviewed the patient's current medications. Scheduled Meds:   . enoxaparin (LOVENOX) injection  50 mg Subcutaneous Q24H  . guaiFENesin  600 mg Oral BID  . hydroxychloroquine  200 mg Oral Daily  . insulin aspart  0-5 Units Subcutaneous QHS  . insulin aspart  0-9 Units Subcutaneous TID WC  . levofloxacin  750 mg Oral Daily  . metFORMIN  500 mg Oral Q breakfast  . [COMPLETED] methylPREDNISolone (SOLU-MEDROL) injection  125 mg Intravenous Once  . metoprolol  50 mg Oral BID  . predniSONE  10-20 mg Oral BID  . [COMPLETED] sodium chloride  250 mL Intravenous Once  . sodium chloride  3 mL Intravenous Q12H  . [DISCONTINUED] sodium chloride   Intravenous STAT  . [DISCONTINUED] levofloxacin (LEVAQUIN) IV  750 mg Intravenous Q24H  . [DISCONTINUED] methylPREDNISolone (SOLU-MEDROL) injection  60 mg Intravenous Q12H   Continuous Infusions:   . [DISCONTINUED] sodium chloride 100 mL/hr at 12/18/11 1556  . [DISCONTINUED] sodium chloride 125 mL/hr (12/19/11 0900)   PRN Meds:.albuterol, ALPRAZolam, HYDROcodone-acetaminophen,  HYDROmorphone (DILAUDID) injection, [COMPLETED] iohexol, ondansetron (ZOFRAN) IV, ondansetron  Assessment/Plan: Acute respiratory distress due to community acquired pneumonia CT of the chest on 11/17/2011 with contrast was negative for acute pulmonary embolism, it did show scattered patchy bilateral interstitial opacities, suspect chronic interstitial lung disease from connective tissue disease such as lupus.  Blood cultures x2 on 12/18/2011 show no growth to date.  Urine legionella pending.  HIV negative.  Strep pneumo negative.  Transition IV levofloxacin to oral levofloxacin.  Defined 5-7 day course of antibiotics. Nebulizer treatments as needed. Oxygen support via nasal canula to keep O2 saturation above 90%.  Suspect patient's cough may be related to chronic interstitial lung disease and may benefit from outpatient pulmonary evaluation.  Continue incentive spirometry.  Shortness of breath  Patient had CT angio chest which was negative for PE but findings included interstitial changes consistent with the patient's history of lupus. May benefit from outpatient pulmonary evaluation.  Leukocytosis  Secondary to combination of steroids and community acquired pneumonia. Resolved.   HTN (hypertension)  Stable. Continue metoprolol.  Diabetes mellitus  Continue metformin.  Hemoglobin A1c on 12/18/2011 17.3 suggesting an average blood 163.  Increase metformin from 500 to thousand milligrams daily.  Further titration to be done as outpatient.  Lupus  Restart it patient on home prednisone  dose.  Anxiety  Ativan TID PRN per home regimen.  Code status: full code  Family education: Updated patient and family at bedside.   Disposition: Home when stable     LOS: 1 day  Andrea [name] A, MD 12/19/2011, 11:16 AM

## 2011-12-20 LAB — BASIC METABOLIC PANEL
CO2: 24 mEq/L (ref 19–32)
Chloride: 104 mEq/L (ref 96–112)
Glucose, Bld: 143 mg/dL — ABNORMAL HIGH (ref 70–99)
Potassium: 3.9 mEq/L (ref 3.5–5.1)
Sodium: 135 mEq/L (ref 135–145)

## 2011-12-20 LAB — CBC
HCT: 38 % (ref 36.0–46.0)
Hemoglobin: 12.3 g/dL (ref 12.0–15.0)
RBC: 4.53 MIL/uL (ref 3.87–5.11)

## 2011-12-20 LAB — LEGIONELLA ANTIGEN, URINE: Legionella Antigen, Urine: NEGATIVE

## 2011-12-20 MED ORDER — UNABLE TO FIND: Status: DC

## 2011-12-20 MED ORDER — LEVOFLOXACIN 750 MG PO TABS: 750.0000 mg | ORAL_TABLET | Freq: Every day | ORAL | Status: DC

## 2011-12-20 MED ORDER — METFORMIN HCL ER (OSM) 1000 MG PO TB24: 1000.0000 mg | ORAL_TABLET | Freq: Every day | ORAL | Status: AC

## 2011-12-20 MED ORDER — BENZONATATE 100 MG PO CAPS
100.0000 mg | ORAL_CAPSULE | Freq: Three times a day (TID) | ORAL | Status: DC | PRN
  Filled 2011-12-20: qty 1

## 2011-12-20 MED ORDER — GUAIFENESIN-DM 100-10 MG/5ML PO SYRP: 5.0000 mL | ORAL_SOLUTION | ORAL | Status: DC | PRN

## 2011-12-20 MED ORDER — BENZONATATE 100 MG PO CAPS: 100.0000 mg | ORAL_CAPSULE | Freq: Three times a day (TID) | ORAL | Status: DC | PRN

## 2011-12-20 MED ORDER — LIVING WELL WITH DIABETES BOOK
Freq: Once | Status: AC
  Administered 2011-12-20: 10:00:00
  Filled 2011-12-20: qty 1

## 2011-12-20 MED ORDER — GUAIFENESIN ER 600 MG PO TB12: 600.0000 mg | ORAL_TABLET | Freq: Two times a day (BID) | ORAL | Status: DC

## 2011-12-20 NOTE — Progress Notes (Signed)
Subjective: Breathing better, but still coughing. No other specific concerns.  Objective: Vital signs in last 24 hours: Filed Vitals:   12/19/11 1555 12/19/11 2200 12/19/11 2228 12/20/11 0600  BP: 135/91 119/81 119/81 122/86  Pulse: 98 99 99 85  Temp: 98.1 F (36.7 C) 98.6 F (37 C)  98.3 F (36.8 C)  TempSrc: Oral Oral  Oral  Resp: 20 20  18   Height:      Weight:      SpO2: 99% 99%  99%   Weight change:   Intake/Output Summary (Last 24 hours) at 12/20/11 1027 Last data filed at 12/20/11 4540  Gross per 24 hour  Intake    360 ml  Output      0 ml  Net    360 ml    Physical Exam: General: Awake, Oriented, No acute distress. HEENT: EOMI. Neck: Supple CV: S1 and S2 Lungs: Clear to ascultation bilaterally Abdomen: Soft, Nontender, Nondistended, +bowel sounds. Ext: Good pulses. Trace edema.  Lab Results: Basic Metabolic Panel:  Lab 12/20/11 9811 12/19/11 0512 12/18/11 1132  NA 135 132* 134*  K 3.9 4.5 3.6  CL 104 103 99  CO2 24 22 23   GLUCOSE 143* 280* 159*  BUN 14 10 9   CREATININE 0.73 0.62 0.69  CALCIUM 9.3 8.9 9.4  MG -- -- --  PHOS -- -- --   Liver Function Tests: No results found for this basename: AST:5,ALT:5,ALKPHOS:5,BILITOT:5,PROT:5,ALBUMIN:5 in the last 168 hours No results found for this basename: LIPASE:5,AMYLASE:5 in the last 168 hours No results found for this basename: AMMONIA:5 in the last 168 hours CBC:  Lab 12/20/11 0530 12/19/11 0512 12/18/11 1132  WBC 17.1* 9.5 11.6*  NEUTROABS -- -- 8.2*  HGB 12.3 11.9* 13.1  HCT 38.0 36.7 39.4  MCV 83.9 82.1 81.6  PLT 233 228 230   Cardiac Enzymes: No results found for this basename: CKTOTAL:5,CKMB:5,CKMBINDEX:5,TROPONINI:5 in the last 168 hours BNP (last 3 results) No results found for this basename: PROBNP:3 in the last 8760 hours CBG:  Lab 12/20/11 0736 12/19/11 2142 12/19/11 1846 12/19/11 1646 12/19/11 1248  GLUCAP 128* 213* 137* 180* 381*    Basename 12/18/11 1132  HGBA1C 7.3*    Other Labs: No components found with this basename: POCBNP:3 No results found for this basename: DDIMER:2 in the last 168 hours No results found for this basename: CHOL:2,HDL:2,LDLCALC:2,TRIG:2,CHOLHDL:2,LDLDIRECT:2 in the last 168 hours No results found for this basename: TSH,T4TOTAL,FREET3,T3FREE,FREET4,THYROIDAB in the last 168 hours No results found for this basename: VITAMINB12:2,FOLATE:2,FERRITIN:2,TIBC:2,IRON:2,RETICCTPCT:2 in the last 168 hours  Micro Results: No results found for this or any previous visit (from the past 240 hour(s)).  Studies/Results: Dg Chest 2 View  12/18/2011  *RADIOLOGY REPORT*  Clinical Data: Shortness of breath and cough  CHEST - 2 VIEW  Comparison: 08/15/2011  Findings: There is mild cardiac enlargement.  Lung volumes are low and there is asymmetric elevation of the right hemidiaphragm.  Moderate bronchitic changes are noted with atelectasis in both lung bases.  No edema or effusions.  No airspace consolidation.  IMPRESSION:  1.  Bibasilar atelectasis. 2.  Bronchitic changes.   Original Report Authenticated By: Signa Kell, M.D.    Ct Angio Chest W/cm &/or Wo Cm  12/18/2011  *RADIOLOGY REPORT*  Clinical Data: Shortness of breath, history of lupus and chronic bronchitis  CT ANGIOGRAPHY CHEST  Technique:  Multidetector CT imaging of the chest using the standard protocol during bolus administration of intravenous contrast. Multiplanar reconstructed images including MIPs were obtained and reviewed  to evaluate the vascular anatomy.  Contrast: OMNIPAQUE IOHEXOL 350 MG/ML SOLN  Comparison: 08/17/2011 chest x-ray  Findings: No significant central or proximal hilar filling defect or pulmonary embolus by CTA.  Limited assessment of the peripheral segmental and subsegmental branching vasculature for small emboli. Normal heart size.  No pericardial or pleural effusion.  No hiatal hernia.  Mildly prominent mediastinal lymph nodes without definite adenopathy.  Upper  abdominal imaging demonstrates no acute process.  Lung windows demonstrate patchy scattered peripheral chronic appearing interstitial opacities in the upper lobes anteriorly but also in the lingula, right middle lobe, and both lower lobes. Within the right middle lobe and both lower lobes, there are central air bronchograms.  The pattern and appearance is compatible with chronic interstitial lung disease which can be seen with lupus.  Difficult to exclude underlying acute pulmonary hemorrhage or infection.  No associated bronchiectasis, mucus plugging, bronchial wall thickening.  Trachea central airways appear patent.  No osseous abnormality.  IMPRESSION: Negative for significant acute pulmonary embolus by CTA within the central and proximal hilar vasculature.  Scattered patchy bilateral interstitial opacities, suspect chronic interstitial lung disease from connective tissue disease such as a lupus.  See above comment.   Original Report Authenticated By: Judie Petit. Miles Costain, M.D.     Medications: I have reviewed the patient's current medications. Scheduled Meds:    . enoxaparin (LOVENOX) injection  50 mg Subcutaneous Q24H  . guaiFENesin  600 mg Oral BID  . hydroxychloroquine  200 mg Oral Daily  . insulin aspart  0-5 Units Subcutaneous QHS  . insulin aspart  0-9 Units Subcutaneous TID WC  . levofloxacin  750 mg Oral Daily  . living well with diabetes book   Does not apply Once  . metFORMIN  1,000 mg Oral Q breakfast  . metoprolol  50 mg Oral BID  . predniSONE  10 mg Oral q1800  . predniSONE  20 mg Oral Q breakfast  . sodium chloride  3 mL Intravenous Q12H  . [DISCONTINUED] levofloxacin (LEVAQUIN) IV  750 mg Intravenous Q24H  . [DISCONTINUED] metFORMIN  500 mg Oral Q breakfast  . [DISCONTINUED] methylPREDNISolone (SOLU-MEDROL) injection  60 mg Intravenous Q12H   Continuous Infusions:    . [DISCONTINUED] sodium chloride 125 mL/hr (12/19/11 0900)   PRN Meds:.albuterol, ALPRAZolam,  guaiFENesin-dextromethorphan, HYDROcodone-acetaminophen, HYDROmorphone (DILAUDID) injection, ondansetron (ZOFRAN) IV, ondansetron  Assessment/Plan: Acute respiratory distress due to community acquired pneumonia CT of the chest on 11/17/2011 with contrast was negative for acute pulmonary embolism, it did show scattered patchy bilateral interstitial opacities, suspect chronic interstitial lung disease from connective tissue disease such as lupus.  Blood cultures x2 on 12/18/2011 show no growth to date.  Urine legionella pending.  HIV negative.  Strep pneumo negative.  Transitioned IV levofloxacin to oral levofloxacin.  Define 5-7 day course of antibiotics. Nebulizer treatments as needed. Suspect patient's cough may be related to chronic interstitial lung disease and may benefit from outpatient pulmonary evaluation, patient given resources to call pulmonary.  Continue incentive spirometry.  Shortness of breath  Patient had CT angio chest which was negative for PE but findings included interstitial changes consistent with the patient's history of lupus. May benefit from outpatient pulmonary evaluation.  Leukocytosis  Secondary to combination of steroids and community acquired pneumonia.   HTN (hypertension)  Stable. Continue metoprolol.  Diabetes mellitus  Continue metformin.  Hemoglobin A1c on 12/18/2011 7.3 suggesting an average blood 163.  Increase metformin from 500 to thousand milligrams daily.  Further titration to be done  as outpatient.  Lupus  Restart it patient on home prednisone dose.  Anxiety  Ativan TID PRN per home regimen.  Code status: full code  Family education: Updated patient at bedside.   Disposition: Discharge the patient today.     LOS: 2 days  Laura Radilla A, MD 12/20/2011, 10:27 AM

## 2011-12-20 NOTE — Discharge Summary (Signed)
Physician Discharge Summary  Andrea [name] ZOX:[id number] DOB: [birth year] DOA: 12/18/2011  PCP: Jearld Lesch, MD  Admit date: 12/18/2011 Discharge date: 12/20/2011  Recommendations for Outpatient Follow-up:  Followup with Jearld Lesch, MD (PCP) in 1 week. Consider following up with a pulmonologist as per your PCP.  PCP to followup on pending urine legionella.  Discharge Diagnoses:  Principal Problem:  *CAP (community acquired pneumonia) Active Problems:  Leukocytosis  HTN (hypertension)  Diabetes mellitus  Lupus   Discharge Condition: Stable  Diet recommendation: Diabetic diet.  Filed Weights   12/18/11 1108  Weight: 111.131 kg (245 lb)    History of present illness:  On admission: "41 year old female with past medical history of SLE, depression, HTN, DM who presented with worsening shortness of breath for past few days prior to this admission. Patient does report having chronic bronchitis and cough however this time she has had shortness of breath and feeling weak. She has been seen in the lupus clinic in [facility] 2 days prior were she was given a flu shot and recommended to taper down her prednisone to 10 mg twice daily. Patient reports associated subjective fever but no chest pain, no palpitations. She does have occasional lightheadedness but no loss of consciousness. In ED patient was found to be hypoxic and with leukocytosis."  Hospital Course:  Acute respiratory distress due to community acquired pneumonia CT of the chest on 11/17/2011 with contrast was negative for acute pulmonary embolism, it did show scattered patchy bilateral interstitial opacities, suspect chronic interstitial lung disease from connective tissue disease such as lupus.  Blood cultures x2 on 12/18/2011 show no growth to date.  Urine legionella pending.  HIV negative.  Strep pneumo negative.  Transitioned IV levofloxacin to oral levofloxacin.  Define 7 day course of antibiotics. Nebulizer  treatments as needed. Suspect patient's cough may be related to chronic interstitial lung disease and may benefit from outpatient pulmonary evaluation, patient given resources to call pulmonary.  Continue incentive spirometry.  Shortness of breath  Patient had CT angio chest which was negative for PE but findings included interstitial changes consistent with the patient's history of lupus. May benefit from outpatient pulmonary evaluation.  Leukocytosis  Secondary to combination of steroids and community acquired pneumonia.   HTN (hypertension)  Stable. Continue metoprolol.  Diabetes mellitus  Continue metformin.  Hemoglobin A1c on 12/18/2011 7.3 suggesting an average blood 163.  Increase metformin from 500 to 1000 mg daily.  Further titration to be done as outpatient.  Lupus  Restart it patient on home prednisone dose.  Anxiety  Ativan TID PRN per home regimen.  Procedures:  As below.  Consultations:  None.  Discharge Exam: Filed Vitals:   12/19/11 1555 12/19/11 2200 12/19/11 2228 12/20/11 0600  BP: 135/91 119/81 119/81 122/86  Pulse: 98 99 99 85  Temp: 98.1 F (36.7 C) 98.6 F (37 C)  98.3 F (36.8 C)  TempSrc: Oral Oral  Oral  Resp: 20 20  18   Height:      Weight:      SpO2: 99% 99%  99%   Discharge Instructions  Discharge Orders    Future Orders Please Complete By Expires   Diet Carb Modified      Increase activity slowly      Discharge instructions      Comments:   Followup with Jearld Lesch, MD (PCP) in 1 week. Consider following up with a pulmonologist if your PCP thinks it is appropriate.  Medication List     As of 12/20/2011 10:43 AM    TAKE these medications         ALPRAZolam 0.25 MG tablet   Commonly known as: XANAX   Take 0.25 mg by mouth 3 (three) times daily as needed. anxiety      benzonatate 100 MG capsule   Commonly known as: TESSALON   Take 1 capsule (100 mg total) by mouth 3 (three) times daily as needed for cough.       guaiFENesin 600 MG 12 hr tablet   Commonly known as: MUCINEX   Take 1 tablet (600 mg total) by mouth 2 (two) times daily.      guaiFENesin-dextromethorphan 100-10 MG/5ML syrup   Commonly known as: ROBITUSSIN DM   Take 5 mLs by mouth every 4 (four) hours as needed for cough.      hydroxychloroquine 200 MG tablet   Commonly known as: PLAQUENIL   Take by mouth daily.      levofloxacin 750 MG tablet   Commonly known as: LEVAQUIN   Take 1 tablet (750 mg total) by mouth daily.      metformin 1000 MG (OSM) 24 hr tablet   Commonly known as: FORTAMET   Take 1 tablet (1,000 mg total) by mouth daily with breakfast.      metoprolol 50 MG tablet   Commonly known as: LOPRESSOR   Take 50 mg by mouth 2 (two) times daily.      predniSONE 10 MG tablet   Commonly known as: DELTASONE   Take 10-20 mg by mouth 2 (two) times daily. Take 2 tablets in the morning and 1 tablet in the pm      UNABLE TO FIND   Please excuse Ms. Pendley from any work obligations from 12/18/2011 till 12/23/2011. Patient to return to work on 12/24/2011 without any work restrictions.           Follow-up Information    Follow up with [facility], MD. (Please followup with [facility] in 1-2 weeks.)    Contact information:   [address] [place] [address] [phone]       Follow up with Donnetta Hail, MD. (Rheumatology in [facility].)    Contact information:   [address]  [place] [address] [phone]       Follow up with Jearld Lesch, MD. Schedule an appointment as soon as possible for a visit in 1 week.   Contact information:   [address] [place] [address] [phone]           The results of significant diagnostics from this hospitalization (including imaging, microbiology, ancillary and laboratory) are listed below for reference.    Significant Diagnostic Studies: Dg Chest 2 View  12/18/2011  *RADIOLOGY REPORT*  Clinical Data: Shortness of  breath and cough  CHEST - 2 VIEW  Comparison: 08/15/2011  Findings: There is mild cardiac enlargement.  Lung volumes are low and there is asymmetric elevation of the right hemidiaphragm.  Moderate bronchitic changes are noted with atelectasis in both lung bases.  No edema or effusions.  No airspace consolidation.  IMPRESSION:  1.  Bibasilar atelectasis. 2.  Bronchitic changes.   Original Report Authenticated By: Signa Kell, M.D.    Ct Angio Chest W/cm &/or Wo Cm  12/18/2011  *RADIOLOGY REPORT*  Clinical Data: Shortness of breath, history of lupus and chronic bronchitis  CT ANGIOGRAPHY CHEST  Technique:  Multidetector CT imaging of the chest using the standard  protocol during bolus administration of intravenous contrast. Multiplanar reconstructed images including MIPs were obtained and reviewed to evaluate the vascular anatomy.  Contrast: OMNIPAQUE IOHEXOL 350 MG/ML SOLN  Comparison: 08/17/2011 chest x-ray  Findings: No significant central or proximal hilar filling defect or pulmonary embolus by CTA.  Limited assessment of the peripheral segmental and subsegmental branching vasculature for small emboli. Normal heart size.  No pericardial or pleural effusion.  No hiatal hernia.  Mildly prominent mediastinal lymph nodes without definite adenopathy.  Upper abdominal imaging demonstrates no acute process.  Lung windows demonstrate patchy scattered peripheral chronic appearing interstitial opacities in the upper lobes anteriorly but also in the lingula, right middle lobe, and both lower lobes. Within the right middle lobe and both lower lobes, there are central air bronchograms.  The pattern and appearance is compatible with chronic interstitial lung disease which can be seen with lupus.  Difficult to exclude underlying acute pulmonary hemorrhage or infection.  No associated bronchiectasis, mucus plugging, bronchial wall thickening.  Trachea central airways appear patent.  No osseous abnormality.  IMPRESSION:  Negative for significant acute pulmonary embolus by CTA within the central and proximal hilar vasculature.  Scattered patchy bilateral interstitial opacities, suspect chronic interstitial lung disease from connective tissue disease such as a lupus.  See above comment.   Original Report Authenticated By: Judie Petit. Miles Costain, M.D.     Microbiology: No results found for this or any previous visit (from the past 240 hour(s)).   Labs: Basic Metabolic Panel:  Lab 12/20/11 1610 12/19/11 0512 12/18/11 1132  NA 135 132* 134*  K 3.9 4.5 3.6  CL 104 103 99  CO2 24 22 23   GLUCOSE 143* 280* 159*  BUN 14 10 9   CREATININE 0.73 0.62 0.69  CALCIUM 9.3 8.9 9.4  MG -- -- --  PHOS -- -- --   Liver Function Tests: No results found for this basename: AST:5,ALT:5,ALKPHOS:5,BILITOT:5,PROT:5,ALBUMIN:5 in the last 168 hours No results found for this basename: LIPASE:5,AMYLASE:5 in the last 168 hours No results found for this basename: AMMONIA:5 in the last 168 hours CBC:  Lab 12/20/11 0530 12/19/11 0512 12/18/11 1132  WBC 17.1* 9.5 11.6*  NEUTROABS -- -- 8.2*  HGB 12.3 11.9* 13.1  HCT 38.0 36.7 39.4  MCV 83.9 82.1 81.6  PLT 233 228 230   Cardiac Enzymes: No results found for this basename: CKTOTAL:5,CKMB:5,CKMBINDEX:5,TROPONINI:5 in the last 168 hours BNP: BNP (last 3 results) No results found for this basename: PROBNP:3 in the last 8760 hours CBG:  Lab 12/20/11 0736 12/19/11 2142 12/19/11 1846 12/19/11 1646 12/19/11 1248  GLUCAP 128* 213* 137* 180* 381*    Time spent: 25 minutes   Signed:  Eriyanna Kofoed A  [facility] 12/20/2011, 10:43 AM

## 2011-12-20 NOTE — Progress Notes (Signed)
Pt was alert & oriented at this time and had no c/o pain at this time.  She verbalized understanding of d/c meds & instructions.

## 2011-12-20 NOTE — Progress Notes (Signed)
Pt ambulated with NT without O2 up/down hall.  O2 dropped to 88% about a minute. Majority of time stayed at 90%.  When pt came back & sat in bed O2 went up to 97%.

## 2011-12-20 NOTE — Progress Notes (Signed)
Pt asked if it would be okay for her to drive herself home.  He did not recommend her to do this b/c she had a narcotic this am.  He said that we could not stop her from driving home.

## 2011-12-21 LAB — GLUCOSE, CAPILLARY: Glucose-Capillary: 156 mg/dL — ABNORMAL HIGH (ref 70–99)

## 2011-12-24 LAB — CULTURE, BLOOD (ROUTINE X 2)
Culture: NO GROWTH
Culture: NO GROWTH

## 2011-12-29 ENCOUNTER — Encounter: Payer: Self-pay | Admitting: *Deleted

## 2011-12-30 ENCOUNTER — Ambulatory Visit (INDEPENDENT_AMBULATORY_CARE_PROVIDER_SITE_OTHER): Payer: 59 | Admitting: Internal Medicine

## 2011-12-30 ENCOUNTER — Encounter: Payer: Self-pay | Admitting: Internal Medicine

## 2011-12-30 VITALS — BP 132/92 | HR 114 | Temp 98.0°F | Ht 65.0 in | Wt 246.6 lb

## 2011-12-30 DIAGNOSIS — R05 Cough: Secondary | ICD-10-CM

## 2011-12-30 NOTE — Patient Instructions (Addendum)
Please have full PFT breathing test I am goin tto run your CT scan by a chest radiologist and get back to you about observation with simple treatment for cough versus bronchoscopy now

## 2011-12-30 NOTE — Progress Notes (Signed)
Subjective:    Patient ID: Andrea [name], female    DOB: 01-02-1971, 41 y.o.   MRN: [id number]  HPI  PCP is Jearld Lesch, MD Rheumatologist is Dr Azzie Roup   IOV 12/30/2011 41 year old AA Female.   SLE x 10 years. Was [facility]. Recently switched to Dr Dareen Piano  - Systems involved: Artharlgia  - Flares - none in 2 year   Currently: SLE under remisson  - Rx Plaquenil , prednisone (on and off x years, currently 4 months) and celebrex prn    Other med issues: Obeseity, DM, Anxiety   Referred for chronic cough x 6 months. Insidious onset. Does recollect preceding URI + and got Rx for bronchitis. Episodic with occ severe episodes. Quality is dry sometime and sometimes white mucus.  When she coughs she feels dyspneic and gasping for air. Cough made worse by lying flat, talking moving, Cough relieved by sleeping with 3 pillows. Describes cough as hoarse and barking. Has had several rounds of antibiotics for cough without relief. Was started on prednisone 4 months ago specifically for cough and not for SLE but  No relief. Cough course is unchanged. Cough is associated with chest pain  Cough is associated with dyspnea x 5 months. Dyspnea made worse by everything that makes her cough but also walking short distances. Dyspnea relieved by rest. Dyspnea rated as moderate. Dyspnea is not associated with chest pain   Then on 12/26/11 got flu shot and pneumovax att [facility] and then got due to above admitted via ER on 12/18/11 . CT angio reuled out PE but showed signs of ILD. Rx as CAP and discharged but hospitalist questioned ILD and referred here   Cough relevant hx   BP  - is on metoprolol. Not on aCE inhibit  Sinus issues  - denies  Allergies  - denies  GI/GERD  - denies GERD  - denies being on PPI  Pulmonary issues  - denies dx of asthma, or any pulmonary issues or hemoptysis  - CT chest 12/18/11 - PE negative but ? ILD in middle lobe and lingula (per Dr Fredirick Lathe  unofficial reivw: not distinctive of any specific disease pattern)  Dr Gretta Cool Reflux Symptom Index (> 13-15 suggestive of LPR cough) 0 -> 5  =  none ->severe problem 12/30/2011   Hoarseness of problem with voice 0  Clearing  Of Throat 3  Excess throat mucus or feeling of post nasal drip 2  Difficulty swallowing food, liquid or tablets 1  Cough after eating or lying down 5  Breathing difficulties or choking episodes 5  Troublesome or annoying cough 5  Sensation of something sticking in throat or lump in throat 1  Heartburn, chest pain, indigestion, or stomach acid coming up 4  TOTAL 26     Kouffman Reflux v Neurogenic Cough Differentiator Reflux Comments  Do you awaken from a sound sleep coughing violently?                            With trouble breathing? Yes   Do you have choking episodes when you cannot  Get enough air, gasping for air ?              Yes   Do you usually cough when you lie down into  The bed, or when you just lie down to rest ?  Yes   Do you usually cough after meals or eating?         Yes   Do you cough when (or after) you bend over?    Yes   GERD SCORE  5   Kouffman Reflux v Neurogenic Cough Differentiator Neurogenic   Do you more-or-less cough all day long? y   Does change of temperature make you cough? y   Does laughing or chuckling cause you to cough? y   Do fumes (perfume, automobile fumes, burned  Toast, etc.,) cause you to cough ?      y   Does speaking, singing, or talking on the phone cause you to cough   ?               y   Neurogenic/Airway score 5        Review of Systems  Constitutional: Positive for unexpected weight change. Negative for fever.  HENT: Positive for sore throat and sneezing. Negative for ear pain, nosebleeds, congestion, rhinorrhea, trouble swallowing, dental problem, postnasal drip and sinus pressure.   Eyes: Negative for redness and itching.  Respiratory: Positive for cough and shortness of  breath. Negative for chest tightness and wheezing.   Cardiovascular: Positive for chest pain and palpitations. Negative for leg swelling.  Gastrointestinal: Negative for nausea and vomiting.  Genitourinary: Negative for dysuria.  Musculoskeletal: Negative for joint swelling.  Skin: Negative for rash.  Neurological: Positive for headaches.  Hematological: Does not bruise/bleed easily.  Psychiatric/Behavioral: Negative for dysphoric mood. The patient is nervous/anxious.    Current outpatient prescriptions:ALPRAZolam (XANAX) 0.25 MG tablet, Take 0.25 mg by mouth 3 (three) times daily as needed. anxiety, Disp: , Rfl: ;  benzonatate (TESSALON) 100 MG capsule, Take 1 capsule (100 mg total) by mouth 3 (three) times daily as needed for cough., Disp: 20 capsule, Rfl: 0;  guaiFENesin (MUCINEX) 600 MG 12 hr tablet, Take 1 tablet (600 mg total) by mouth 2 (two) times daily., Disp: 14 tablet, Rfl: 0 guaiFENesin-dextromethorphan (ROBITUSSIN DM) 100-10 MG/5ML syrup, Take 5 mLs by mouth every 4 (four) hours as needed for cough., Disp: 120 mL, Rfl: 0;  hydroxychloroquine (PLAQUENIL) 200 MG tablet, Take by mouth daily., Disp: , Rfl: ;  metFORMIN (FORTAMET) 1000 MG (OSM) 24 hr tablet, Take 1 tablet (1,000 mg total) by mouth daily with breakfast., Disp: 30 tablet, Rfl: 0 metoprolol (LOPRESSOR) 50 MG tablet, Take 50 mg by mouth 2 (two) times daily.  , Disp: , Rfl: ;  predniSONE (DELTASONE) 10 MG tablet, Take 10 mg by mouth daily. , Disp: , Rfl: ;  promethazine-codeine (PHENERGAN WITH CODEINE) 6.25-10 MG/5ML syrup, Take 5 mLs by mouth Every 12 hours as needed., Disp: , Rfl:      Objective:   Physical Exam  Vitals reviewed. Constitutional: She is oriented to person, place, and time. She appears well-developed and well-nourished. No distress.       Body mass index is 41.04 kg/(m^2). Barking cough occ +  HENT:  Head: Normocephalic and atraumatic.  Right Ear: External ear normal.  Left Ear: External ear normal.    Mouth/Throat: Oropharynx is clear and moist. No oropharyngeal exudate.  Eyes: Conjunctivae normal and EOM are normal. Pupils are equal, round, and reactive to light. Right eye exhibits no discharge. Left eye exhibits no discharge. No scleral icterus.  Neck: Normal range of motion. Neck supple. No JVD present. No tracheal deviation present. No thyromegaly present.  Cardiovascular: Normal rate, regular rhythm, normal heart sounds and intact distal pulses.  Exam reveals no gallop and no friction rub.   No murmur heard. Pulmonary/Chest: Effort normal and breath sounds normal. No respiratory distress. She has no wheezes. She has no rales. She exhibits no tenderness.  Abdominal: Soft. Bowel sounds are normal. She exhibits no distension and no mass. There is no tenderness. There is no rebound and no guarding.  Musculoskeletal: Normal range of motion. She exhibits no edema and no tenderness.  Lymphadenopathy:    She has no cervical adenopathy.  Neurological: She is alert and oriented to person, place, and time. She has normal reflexes. No cranial nerve deficit. She exhibits normal muscle tone. Coordination normal.  Skin: Skin is warm and dry. No rash noted. She is not diaphoretic. No erythema. No pallor.  Psychiatric: She has a normal mood and affect. Her behavior is normal. Judgment and thought content normal.          Assessment & Plan:

## 2012-01-05 ENCOUNTER — Ambulatory Visit (HOSPITAL_COMMUNITY)
Admission: RE | Admit: 2012-01-05 | Discharge: 2012-01-05 | Disposition: A | Discharge status: Home / Self Care | Payer: 59 | Source: Ambulatory Visit | Attending: Internal Medicine | Admitting: Internal Medicine

## 2012-01-05 DIAGNOSIS — E669 Obesity, unspecified: Secondary | ICD-10-CM | POA: Insufficient documentation

## 2012-01-05 DIAGNOSIS — M329 Systemic lupus erythematosus, unspecified: Secondary | ICD-10-CM | POA: Insufficient documentation

## 2012-01-05 DIAGNOSIS — E119 Type 2 diabetes mellitus without complications: Secondary | ICD-10-CM | POA: Insufficient documentation

## 2012-01-05 DIAGNOSIS — R059 Cough, unspecified: Secondary | ICD-10-CM | POA: Insufficient documentation

## 2012-01-05 DIAGNOSIS — R05 Cough: Secondary | ICD-10-CM

## 2012-01-05 LAB — PULMONARY FUNCTION TEST

## 2012-01-05 MED ORDER — ALBUTEROL SULFATE (5 MG/ML) 0.5% IN NEBU
2.5000 mg | INHALATION_SOLUTION | Freq: Once | RESPIRATORY_TRACT | Status: AC
  Administered 2012-01-05: 2.5 mg via RESPIRATORY_TRACT

## 2012-01-16 ENCOUNTER — Telehealth: Payer: Self-pay | Admitting: Internal Medicine

## 2012-01-16 NOTE — Telephone Encounter (Signed)
lmomtcb x1 for Andrea [name]

## 2012-01-17 ENCOUNTER — Ambulatory Visit (HOSPITAL_COMMUNITY)
Admission: RE | Admit: 2012-01-17 | Discharge: 2012-01-17 | Disposition: A | Discharge status: Home / Self Care | Payer: 59 | Source: Ambulatory Visit | Attending: Internal Medicine | Admitting: Internal Medicine

## 2012-01-17 ENCOUNTER — Telehealth: Payer: Self-pay | Admitting: Internal Medicine

## 2012-01-17 ENCOUNTER — Ambulatory Visit (HOSPITAL_COMMUNITY): Admission: RE | Admit: 2012-01-17 | Payer: 59 | Source: Ambulatory Visit | Admitting: Internal Medicine

## 2012-01-17 ENCOUNTER — Encounter (HOSPITAL_COMMUNITY)
Admission: RE | Disposition: A | Discharge status: Home / Self Care | Payer: Self-pay | Source: Ambulatory Visit | Attending: Internal Medicine

## 2012-01-17 ENCOUNTER — Encounter (HOSPITAL_COMMUNITY): Admission: RE | Payer: Self-pay | Source: Ambulatory Visit

## 2012-01-17 ENCOUNTER — Encounter (HOSPITAL_COMMUNITY): Payer: Self-pay | Admitting: Radiology

## 2012-01-17 ENCOUNTER — Ambulatory Visit (HOSPITAL_COMMUNITY): Payer: 59

## 2012-01-17 DIAGNOSIS — R05 Cough: Secondary | ICD-10-CM | POA: Insufficient documentation

## 2012-01-17 DIAGNOSIS — Z79899 Other long term (current) drug therapy: Secondary | ICD-10-CM

## 2012-01-17 DIAGNOSIS — J387 Other diseases of larynx: Secondary | ICD-10-CM

## 2012-01-17 DIAGNOSIS — J383 Other diseases of vocal cords: Secondary | ICD-10-CM | POA: Insufficient documentation

## 2012-01-17 DIAGNOSIS — R059 Cough, unspecified: Secondary | ICD-10-CM | POA: Insufficient documentation

## 2012-01-17 DIAGNOSIS — J849 Interstitial pulmonary disease, unspecified: Secondary | ICD-10-CM

## 2012-01-17 DIAGNOSIS — M329 Systemic lupus erythematosus, unspecified: Secondary | ICD-10-CM | POA: Insufficient documentation

## 2012-01-17 DIAGNOSIS — J8410 Pulmonary fibrosis, unspecified: Secondary | ICD-10-CM | POA: Insufficient documentation

## 2012-01-17 HISTORY — PX: VIDEO BRONCHOSCOPY: SHX5072

## 2012-01-17 LAB — BODY FLUID CELL COUNT WITH DIFFERENTIAL
Monocyte-Macrophage-Serous Fluid: 65 % (ref 50–90)
Total Nucleated Cell Count, Fluid: 230 cu mm (ref 0–1000)

## 2012-01-17 SURGERY — BRONCHOSCOPY, WITH FLUOROSCOPY
Anesthesia: Moderate Sedation | Laterality: Bilateral

## 2012-01-17 MED ORDER — FENTANYL CITRATE 0.05 MG/ML IJ SOLN
INTRAMUSCULAR | Status: AC
  Filled 2012-01-17: qty 4

## 2012-01-17 MED ORDER — PHENYLEPHRINE HCL 0.25 % NA SOLN
NASAL | Status: DC | PRN
  Administered 2012-01-17: 2 via NASAL

## 2012-01-17 MED ORDER — LIDOCAINE HCL (PF) 1 % IJ SOLN
6.0000 mL | Freq: Once | INTRAMUSCULAR | Status: AC
  Administered 2012-01-17: 6 mL

## 2012-01-17 MED ORDER — MIDAZOLAM HCL 10 MG/2ML IJ SOLN
INTRAMUSCULAR | Status: DC | PRN
  Administered 2012-01-17: 1 mg via INTRAVENOUS
  Administered 2012-01-17 (×3): 2 mg via INTRAVENOUS

## 2012-01-17 MED ORDER — SODIUM CHLORIDE 0.9 % IV SOLN
INTRAVENOUS | Status: DC
  Administered 2012-01-17: 14:00:00 via INTRAVENOUS

## 2012-01-17 MED ORDER — MIDAZOLAM HCL 5 MG/ML IJ SOLN
INTRAMUSCULAR | Status: AC
  Filled 2012-01-17: qty 2

## 2012-01-17 MED ORDER — GABAPENTIN 300 MG PO CAPS: ORAL_CAPSULE | ORAL | Status: DC

## 2012-01-17 MED ORDER — LIDOCAINE HCL 2 % EX GEL
CUTANEOUS | Status: DC | PRN
  Administered 2012-01-17: 2

## 2012-01-17 MED ORDER — LIDOCAINE HCL (PF) 1 % IJ SOLN
INTRAMUSCULAR | Status: DC | PRN
  Administered 2012-01-17: 6 mL

## 2012-01-17 MED ORDER — FENTANYL CITRATE 0.05 MG/ML IJ SOLN
INTRAMUSCULAR | Status: DC | PRN
  Administered 2012-01-17: 50 ug via INTRAVENOUS
  Administered 2012-01-17 (×2): 25 ug via INTRAVENOUS
  Administered 2012-01-17: 50 ug via INTRAVENOUS
  Administered 2012-01-17: 25 ug via INTRAVENOUS

## 2012-01-17 NOTE — Assessment & Plan Note (Signed)
Unclear if cough is related to ILD findings on Middle lobe and lingula. I am not sure what these findings represent. I will get PFT and get Dr Fredirick Lathe to review. Based on this likely needs bronchosopy with lavage + transbronchial biopsy. Of note, the prednisone has not helped. Post bronch willl graduall dc prednisone

## 2012-01-17 NOTE — Op Note (Signed)
Name:  Andrea [name] MRN:  [id number] DOB:  [birth year]  PROCEDURE NOTE  Procedure(s): Flexible bronchoscopy with video assistance [id number]) Bronchial alveolar lavage [phone]) of the LINGULA Transbronchial lung biopsy, single lobe (13086) of the LINGULA   Indications:  Chronic cough, ILD, immunosuppression,. SLE  :  Procedure, benefits, risks and alternatives discussed.  Questions answered.  Consent obtained.  Anesthesia:  Moderate sedation  Procedure summary:  Appropriate equipment was assembled.  The patient was brought to the operating room and identified as Andrea [name].  Safety timeout was performed. The patient was placed supine on the operating table, airway established and moderate sedation  administered    After the appropriate level of  Moderate sedation anesthesia was assured, flexible video bronchoscope was lubricated and inserted through the right naris.    Laryngoscopy evaluation of the upper airway was performed: EPiglottis looked normal. The vocal cords appeard normal without rednes or nodules. However, it was very obvious there was paradoxical vocal cord movement with inhalation and vibration of the vocal cords. This was even with her sedated and she could be heard "clearing her throat at this time"  After this, 1% Lidocaine were administered through the bronchoscope to augment moderate sedation.  The flexible bronch with video assistance, was then passed trhough vocal cords. Airway examination was performed bilaterally to subsegmental level.  Minimal clear secretions were noted, mucosa appeared normal and no endobronchial lesions were identified.  Attention then turned to the lingula. Bronchial alveolar lavage of the LINGULA was performed with 30 ml saline x 3 aliquots with very good returns of clear grey normal looking return  AFter this under fluoroscopy, transbronchial biopsy of the lingula  (passes x4 was performed, after which brnchoscope was withdrawn  after ensuring adequate hemostasis.   Post-procedure chest x-ray was ordered.  Fentanyl Versed 7mg  Lidocaine 30cc x 1% FLuoroscopy 27 seconds  IMPRESSION 1.  Paradoxical Vocal Cord Dysfunction  3. Normal airway 3. BAL of Lingula 4. Transbronchial biopsy x 4 of lingula  Specimens sent: Bronchial alveolar lavage specimen of the LINGULA for cell count. microbiology and cytology. Transbronchial biopsy of the lingula  Complications:  No immediate complications were noted.  Hemodynamic parameters and oxygenation remained stable throughout the procedure.  Estimated blood loss:  None   Dr. Kalman Shan, M.D., [facility].C.P Pulmonary and Critical Care Medicine Staff Physician [facility]monary [facility] Critical [facility] Pager: [phone], If no answer or between  15:00h - 7:00h: call [phone]  01/17/2012 2:03 PM

## 2012-01-17 NOTE — Telephone Encounter (Signed)
I jsut diagnosed VCD in this patient during bronch  Post bronch in procedure suite she had severe cough, severe anxiety and was yelling for cough medicine and no one taking care of her and gagging. She finaly calmed down with lidocaine neb  I counseld her god mom and another person and her about etiology of cough as VCD and explained.  Triage, please do the following  A send this to local pharmacy - ) Take gabapentin 300mg  once daily x 3 days, then 300mg  twice daily x 3 days, then 300mg  three times daily to continue x 30 days with 1 refill. If this makes you too sleepy or drowsy call us and we will cut your medication dosing down  B) advised silence Rx for 2-3 days  C) Ihave advised rest of week from work  D) I have ordered neuro rehab speech Rx  E) give fu visit next week with me to go over bx results

## 2012-01-17 NOTE — Progress Notes (Signed)
Andrea [name] 02/19/1970       TO WHOM IT MAY CONCERN   This is to state patient [name] born on May 15, 1970 has been advised to stay off work due to her illness from 01/17/2012 through 01/23/12. Please do not hesitate to contact me with any questions  Sincerely yours   Dr. Kalman Shan, M.D., [facility].C.P Pulmonary and Critical Care Medicine Staff Physician [facility] Pulmonary and Critical Care [place], [place], [address] Phone [phone]  01/17/2012 2:53 PM

## 2012-01-17 NOTE — Telephone Encounter (Signed)
Called, spoke with pt. Informed her of below recs per MR.  She verbalized understanding and aware gabapentin rx sent to [facility]  She will call back for any problems or questions.  Jenn, MR does not have any openings next wk.  Pls advise where you would like to put pt.  Thank you.

## 2012-01-17 NOTE — Telephone Encounter (Signed)
Duplicate message see phone note 01/16/12

## 2012-01-17 NOTE — H&P (Signed)
  h 7 and p reviewed and is < 66 days old.   Patient with her mom and in procedure suite was very very anxious about procedure. Had to re-explain procedure all over again.  Risks of pneumothorax, hemothorax, sedation/anesthesia complications such as cardiac or respiratory arrest or hypotension, stroke and bleeding all explained. Benefits of diagnosis but limitations of non-diagnosis also explained. Patient verbalized understanding and wished to proceed.  Mom also consented  No change in 30 days

## 2012-01-17 NOTE — Telephone Encounter (Signed)
LMOMTCB x 1 for Andrea [name].

## 2012-01-18 ENCOUNTER — Other Ambulatory Visit: Payer: Self-pay | Admitting: *Deleted

## 2012-01-18 ENCOUNTER — Encounter (HOSPITAL_COMMUNITY): Payer: Self-pay | Admitting: Internal Medicine

## 2012-01-18 LAB — PNEUMOCYSTIS JIROVECI SMEAR BY DFA: Pneumocystis jiroveci Ag: NEGATIVE

## 2012-01-18 MED ORDER — GABAPENTIN 300 MG PO CAPS: ORAL_CAPSULE | ORAL | Status: DC

## 2012-01-18 NOTE — Telephone Encounter (Signed)
Spoke with Melanie-states patient has [employer] and had procedure done yesterday. Needs to know if this needed or has had pre cert done. PCC's please advise. Thanks.

## 2012-01-18 NOTE — Telephone Encounter (Signed)
Spoke to [employer] no precert was needed [employer]

## 2012-01-18 NOTE — Telephone Encounter (Signed)
Appt set for 01-24-12 with TP. Pt is aware. Carron Curie, CMA

## 2012-01-18 NOTE — Telephone Encounter (Signed)
You are here on Monday and Friday next week and are completely full and doubled once each day already. Can this pt follow-up with TP? Carron Curie, CMA

## 2012-01-18 NOTE — Telephone Encounter (Signed)
Andrea [name] is fine. She has VCD and very anxious. I told patient god mom yesterday that NP might be the one seeing

## 2012-01-19 ENCOUNTER — Telehealth: Payer: Self-pay | Admitting: Internal Medicine

## 2012-01-19 LAB — CULTURE, BAL-QUANTITATIVE W GRAM STAIN

## 2012-01-19 NOTE — Telephone Encounter (Signed)
lmomtcb  

## 2012-01-20 NOTE — Telephone Encounter (Signed)
LMOMTCB x 1 

## 2012-01-23 NOTE — Progress Notes (Signed)
Vide bronchoscopy with intervention washing, an intervention biopsy.

## 2012-01-24 ENCOUNTER — Ambulatory Visit: Payer: 59

## 2012-01-24 ENCOUNTER — Ambulatory Visit: Payer: 59 | Admitting: Adult Health

## 2012-01-24 NOTE — Telephone Encounter (Signed)
Pt requesting test results on bronch. Also would like a copy sent to Dr Dareen Piano pt is seeing him today .  MR please advise Thank you

## 2012-01-24 NOTE — Telephone Encounter (Signed)
Nicholos Johns transferred call from [place] at Dr. Linwood Dibbles office. She states the pt is there to be seen and wants bronch results faxed. I advised that the results are not finalized so I cannot send results at this time. I advised the pt has an appt on Thursday to review results at that time.   Per Melbourne Abts she spoke with the pt about her medications and answered all her questions. Nothing further needed. Carron Curie, CMA

## 2012-01-24 NOTE — Telephone Encounter (Signed)
Pt called back again. She is requesting that results of biopsy be faxed to Dr. Lurena Nida @ [phone]. Pt is seeing Dr. Dareen Piano this afternoon at 2pm. (NOTE: Pt. States she has not yet been notified of the results). Pt ph# [phone]. [name]

## 2012-01-25 NOTE — Telephone Encounter (Signed)
I will call Dr Dareen Piano. 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 She can come off steroids.

## 2012-01-26 ENCOUNTER — Encounter: Payer: Self-pay | Admitting: Adult Health

## 2012-01-26 ENCOUNTER — Ambulatory Visit (INDEPENDENT_AMBULATORY_CARE_PROVIDER_SITE_OTHER): Payer: 59 | Admitting: Adult Health

## 2012-01-26 VITALS — BP 118/90 | HR 98 | Temp 97.2°F | Ht 65.0 in | Wt 251.4 lb

## 2012-01-26 DIAGNOSIS — R05 Cough: Secondary | ICD-10-CM

## 2012-01-26 DIAGNOSIS — J383 Other diseases of vocal cords: Secondary | ICD-10-CM

## 2012-01-26 MED ORDER — BENZONATATE 100 MG PO CAPS: 100.0000 mg | ORAL_CAPSULE | Freq: Three times a day (TID) | ORAL | Status: DC | PRN

## 2012-01-26 MED ORDER — PROMETHAZINE-CODEINE 6.25-10 MG/5ML PO SYRP: 5.0000 mL | ORAL_SOLUTION | ORAL | Status: DC | PRN

## 2012-01-26 NOTE — Patient Instructions (Addendum)
Continue on Neurontin 300mg  Three times a day   Delsym 2 tsp Twice daily   Tessalon Three times a day   Phenergan w/ codeine 1-2 tsp every 4-6 hr As needed  For cough, may cause sedation.  Frequent sips of water to avoid coughing and throat clearing.  Goal is to not cough.  Avoid mints -all mints.  Sugarless candy to help soothe throat.  Prilosec 20mg  daily before meal  Chlor tab 4mg  2 At bedtime  .  Follow up Dr. Marchelle Gearing in 4 weeks with chest xray  and As needed   follow up for VCD clinic tomorrow as planned

## 2012-01-26 NOTE — Telephone Encounter (Signed)
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 He already Rx with candida  C) explained no need for steroids which was started for cough: he is already weaning her off it   Will cc NP Tammy so she is aware

## 2012-01-26 NOTE — Progress Notes (Signed)
Subjective:    Patient ID: Andrea [name], female    DOB: [birth year], 41 y.o.   MRN: [id number]  HPI PCP is Jearld Lesch, MD Rheumatologist is Dr Azzie Roup  IOV 12/30/2011 41 year old AA Female.   SLE x 10 years. Was [facility]. Recently switched to Dr Dareen Piano  - Systems involved: Artharlgia  - Flares - none in 2 year   Currently: SLE under remisson  - Rx Plaquenil , prednisone (on and off x years, currently 4 months) and celebrex prn  Other med issues: Obeseity, DM, Anxiety   Referred for chronic cough x 6 months. Insidious onset. Does recollect preceding URI + and got Rx for bronchitis. Episodic with occ severe episodes. Quality is dry sometime and sometimes white mucus.  When she coughs she feels dyspneic and gasping for air. Cough made worse by lying flat, talking moving, Cough relieved by sleeping with 3 pillows. Describes cough as hoarse and barking. Has had several rounds of antibiotics for cough without relief. Was started on prednisone 4 months ago specifically for cough and not for SLE but  No relief. Cough course is unchanged. Cough is associated with chest pain  Cough is associated with dyspnea x 5 months. Dyspnea made worse by everything that makes her cough but also walking short distances. Dyspnea relieved by rest. Dyspnea rated as moderate. Dyspnea is not associated with chest pain   Then on 12/26/11 got flu shot and pneumovax att [facility] and then got due to above admitted via ER on 12/18/11 . CT angio reuled out PE but showed signs of ILD. Rx as CAP and discharged but hospitalist questioned ILD and referred here   Cough relevant hx   BP  - is on metoprolol. Not on aCE inhibit  Sinus issues  - denies  Allergies  - denies  GI/GERD  - denies GERD  - denies being on PPI  Pulmonary issues  - denies dx of asthma, or any pulmonary issues or hemoptysis  - CT chest 12/18/11 - PE negative but ? ILD in middle lobe and lingula (per Dr Fredirick Lathe  unofficial reivw: not distinctive of any specific disease pattern)  Dr Gretta Cool Reflux Symptom Index (> 13-15 suggestive of LPR cough) 0 -> 5  =  none ->severe problem 12/30/2011   Hoarseness of problem with voice 0  Clearing  Of Throat 3  Excess throat mucus or feeling of post nasal drip 2  Difficulty swallowing food, liquid or tablets 1  Cough after eating or lying down 5  Breathing difficulties or choking episodes 5  Troublesome or annoying cough 5  Sensation of something sticking in throat or lump in throat 1  Heartburn, chest pain, indigestion, or stomach acid coming up 4  TOTAL 26     Kouffman Reflux v Neurogenic Cough Differentiator Reflux Comments  Do you awaken from a sound sleep coughing violently?                            With trouble breathing? Yes   Do you have choking episodes when you cannot  Get enough air, gasping for air ?              Yes   Do you usually cough when you lie down into  The bed, or when you just lie down to rest ?  Yes   Do you usually cough after meals or eating?         Yes   Do you cough when (or after) you bend over?    Yes   GERD SCORE  5   Kouffman Reflux v Neurogenic Cough Differentiator Neurogenic   Do you more-or-less cough all day long? y   Does change of temperature make you cough? y   Does laughing or chuckling cause you to cough? y   Do fumes (perfume, automobile fumes, burned  Toast, etc.,) cause you to cough ?      y   Does speaking, singing, or talking on the phone cause you to cough   ?               y   Neurogenic/Airway score 5      01/26/2012 Follow up for cough .  Returns for 1 week follow up for cough Underwent a FOB 01/17/12 , mucosa w/out lesions. Path neg for malignant , bal showed some candida that she was tx prior to FOB. 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  Started on on neurontin 300mg  Three times a day  -reached max dose yesterday.  Cough is unchanged. She is using  tessalon without much help.  CT chest 12/2011 showed PE negative but ? ILD in middle lobe and lingula . Hx of SLE on plaquenil.  No hemotpysis or chest pain . No edema.  Cough remains harsh at times.       Review of Systems  Constitutional:   No  weight loss, night sweats,  Fevers, chills, + fatigue, or  lassitude.  HEENT:   No headaches,  Difficulty swallowing,  Tooth/dental problems, or  Sore throat,                No sneezing, itching, ear ache,  +nasal congestion, post nasal drip,   CV:  No chest pain,  Orthopnea, PND, swelling in lower extremities, anasarca, dizziness, palpitations, syncope.   GI  No heartburn, indigestion, abdominal pain, nausea, vomiting, diarrhea, change in bowel habits, loss of appetite, bloody stools.   Resp:    No coughing up of blood.   Marland Kitchen  No chest wall deformity  Skin: no rash or lesions.  GU: no dysuria, change in color of urine, no urgency or frequency.  No flank pain, no hematuria   MS:  No joint pain or swelling.  No decreased range of motion.  No back pain.  Psych:  No change in mood or affect. No depression or anxiety.  No memory loss.         Objective:   Physical Exam  GEN: A/Ox3; pleasant , NAD, well nourished   HEENT:  E[place]/AT,  EACs-clear, TMs-wnl, NOSE-clear, THROAT-clear, no lesions, no postnasal drip or exudate noted.   NECK:  Supple w/ fair ROM; no JVD; normal carotid impulses w/o bruits; no thyromegaly or nodules palpated; no lymphadenopathy.  RESP  Clear  P & A; w/o, wheezes/ rales/ or rhonchi.no accessory muscle use, no dullness to percussion  CARD:  RRR, no m/r/g  , no peripheral edema, pulses intact, no cyanosis or clubbing.  GI:   Soft & nt; nml bowel sounds; no organomegaly or masses detected.  Musco: Warm bil, no deformities or joint swelling noted.   Neuro: alert, no focal deficits noted.    Skin: Warm, no lesions or rashes         Assessment & Plan:

## 2012-01-26 NOTE — Addendum Note (Signed)
Addended by: Boone Master E on: 01/26/2012 05:50 PM   Modules accepted: Orders

## 2012-01-26 NOTE — Assessment & Plan Note (Signed)
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  Will tx for cyclical cough along with neurontin  Need to continue to follow CXR /CT ? ILD w/ underlying SLE /on plaquenil.    Plan Continue on Neurontin 300mg  Three times a day   Delsym 2 tsp Twice daily   Tessalon Three times a day   Phenergan w/ codeine 1-2 tsp every 4-6 hr As needed  For cough, may cause sedation.  Frequent sips of water to avoid coughing and throat clearing.  Goal is to not cough.  Avoid mints -all mints.  Sugarless candy to help soothe throat.  Prilosec 20mg  daily before meal  Chlor tab 4mg  2 At bedtime  .  Follow up Dr. Marchelle Gearing in 4 weeks with chest xray  and As needed   follow up for VCD clinic tomorrow as planned

## 2012-01-27 ENCOUNTER — Ambulatory Visit: Payer: 59 | Attending: Internal Medicine

## 2012-01-27 DIAGNOSIS — R498 Other voice and resonance disorders: Secondary | ICD-10-CM | POA: Insufficient documentation

## 2012-01-27 DIAGNOSIS — IMO0001 Reserved for inherently not codable concepts without codable children: Secondary | ICD-10-CM | POA: Insufficient documentation

## 2012-02-10 ENCOUNTER — Other Ambulatory Visit: Payer: Self-pay | Admitting: Internal Medicine

## 2012-02-13 LAB — FUNGUS CULTURE W SMEAR

## 2012-02-17 ENCOUNTER — Ambulatory Visit: Payer: 59

## 2012-02-27 ENCOUNTER — Ambulatory Visit: Payer: 59 | Admitting: Internal Medicine

## 2012-02-29 LAB — AFB CULTURE WITH SMEAR (NOT AT ARMC): Acid Fast Smear: NONE SEEN

## 2012-07-06 ENCOUNTER — Other Ambulatory Visit: Payer: Self-pay | Admitting: Adult Health

## 2012-07-12 MED ORDER — BENZONATATE 100 MG PO CAPS: 100.0000 mg | ORAL_CAPSULE | Freq: Three times a day (TID) | ORAL | Status: DC | PRN

## 2012-07-12 NOTE — Telephone Encounter (Signed)
rx printed rather than e-rx redone

## 2012-07-12 NOTE — Telephone Encounter (Signed)
Okayed per TP to refill w/ 3 refills but pt needs to follow up with MR as previously recommended.

## 2012-07-12 NOTE — Addendum Note (Signed)
Addended by: Boone Master E on: 07/12/2012 04:46 PM   Modules accepted: Orders

## 2012-12-20 ENCOUNTER — Other Ambulatory Visit: Payer: Self-pay

## 2013-01-06 ENCOUNTER — Other Ambulatory Visit: Payer: Self-pay | Admitting: Internal Medicine

## 2014-01-21 IMAGING — CR DG CHEST 2V
3 series · 3 of 3 positions shown · non-contrast
Comparison: 05/12/2010

CLINICAL DATA: Productive cough for 2 weeks.

CHEST - 2 VIEW

[view not recorded (1 of 3)]
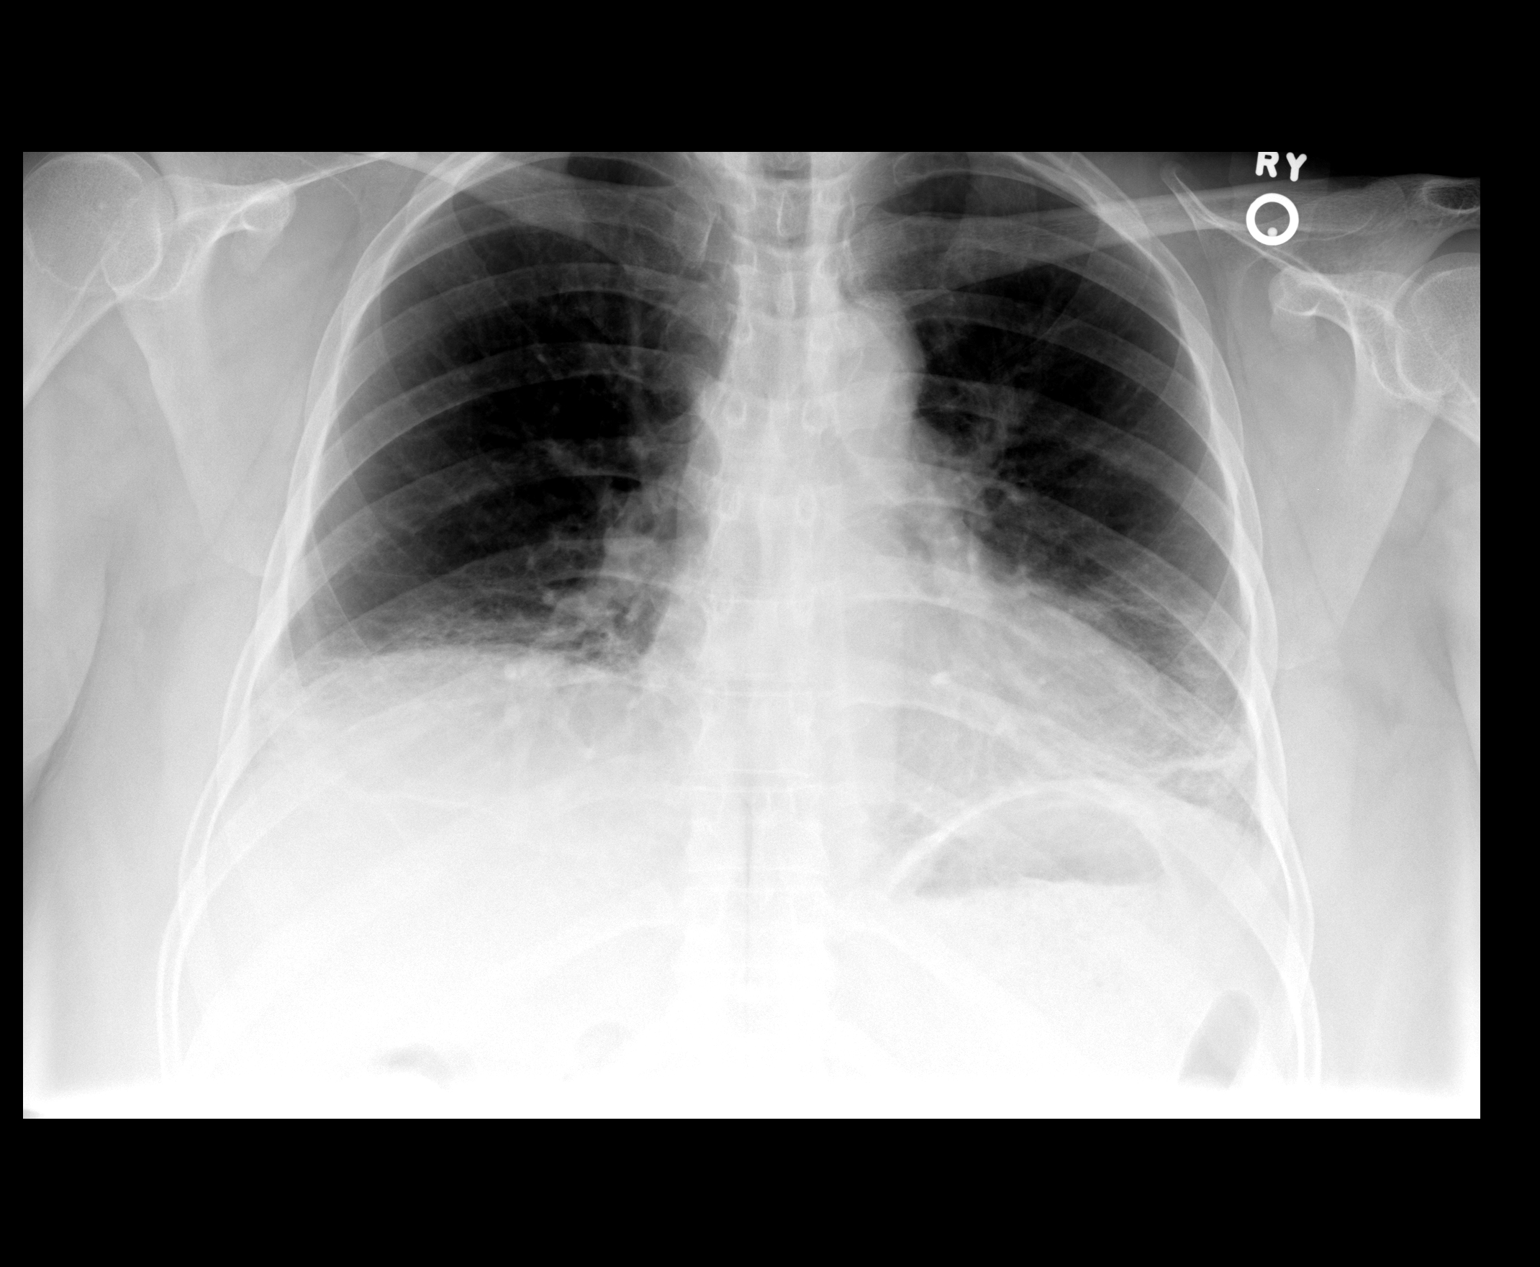

[view not recorded (2 of 3)]
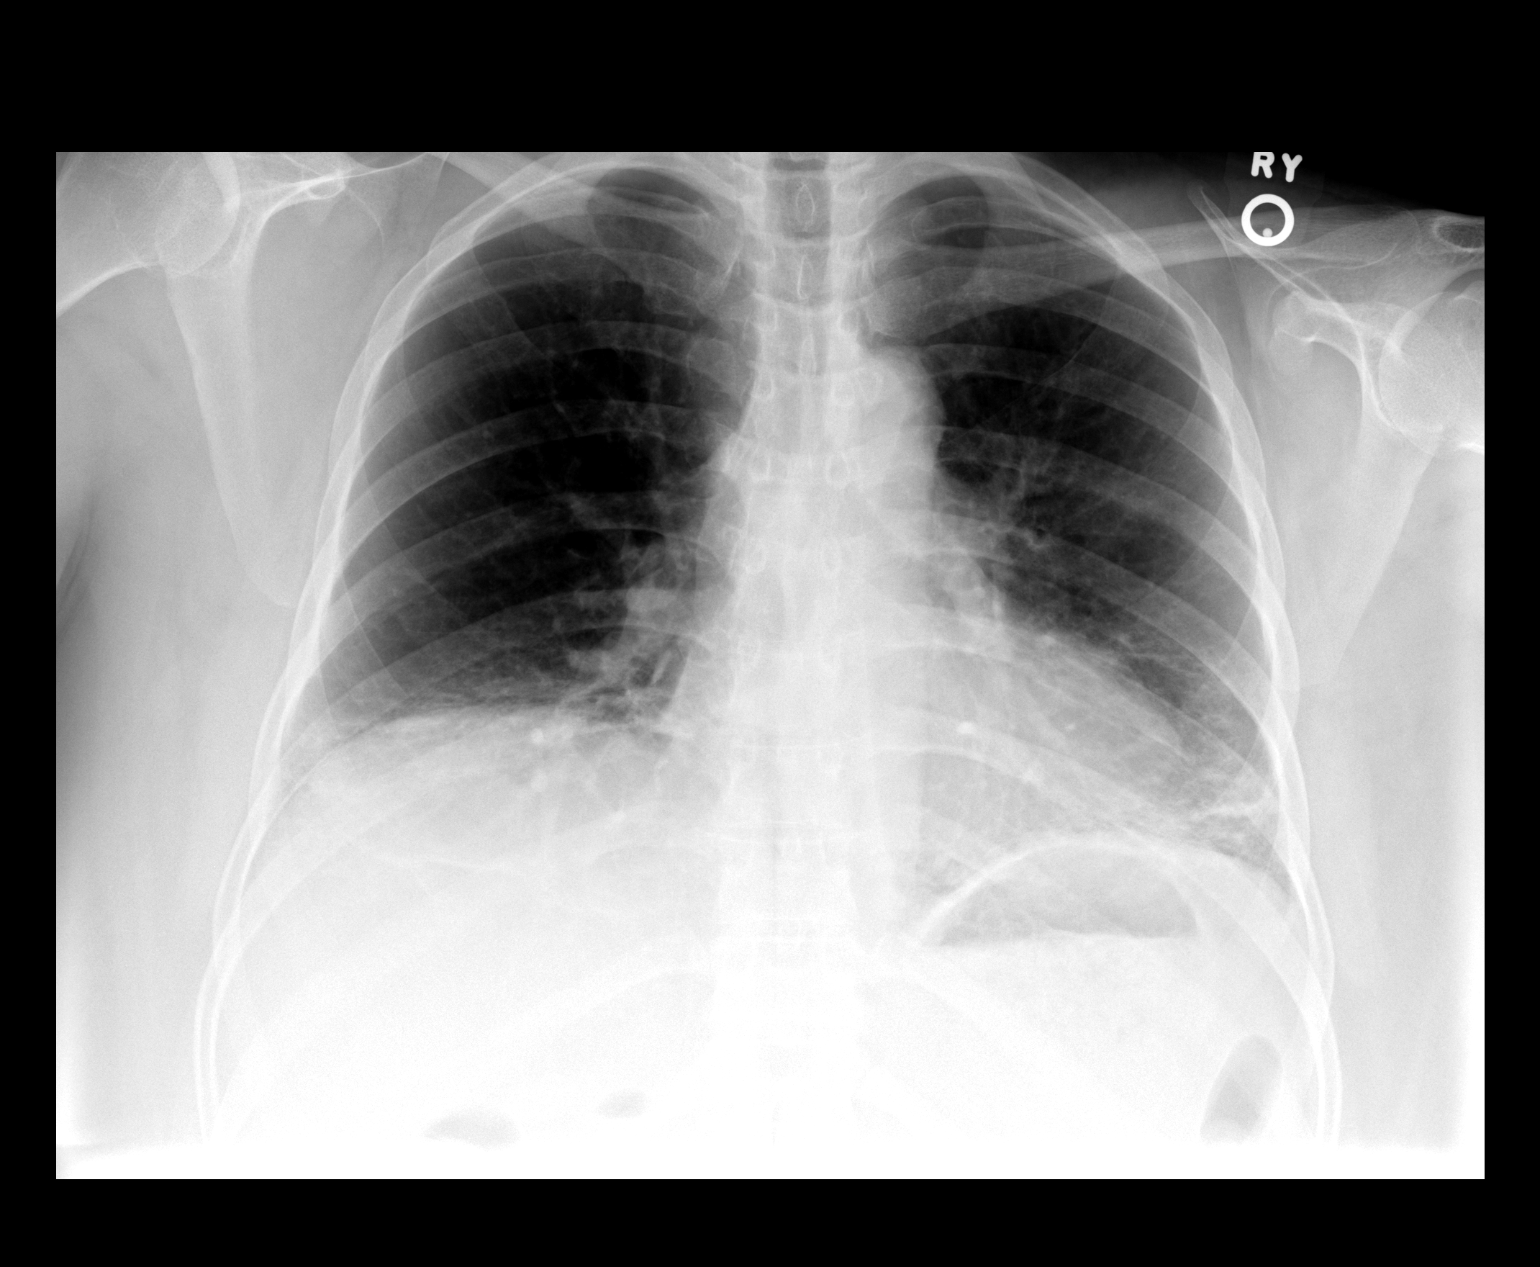

[view not recorded (3 of 3)]
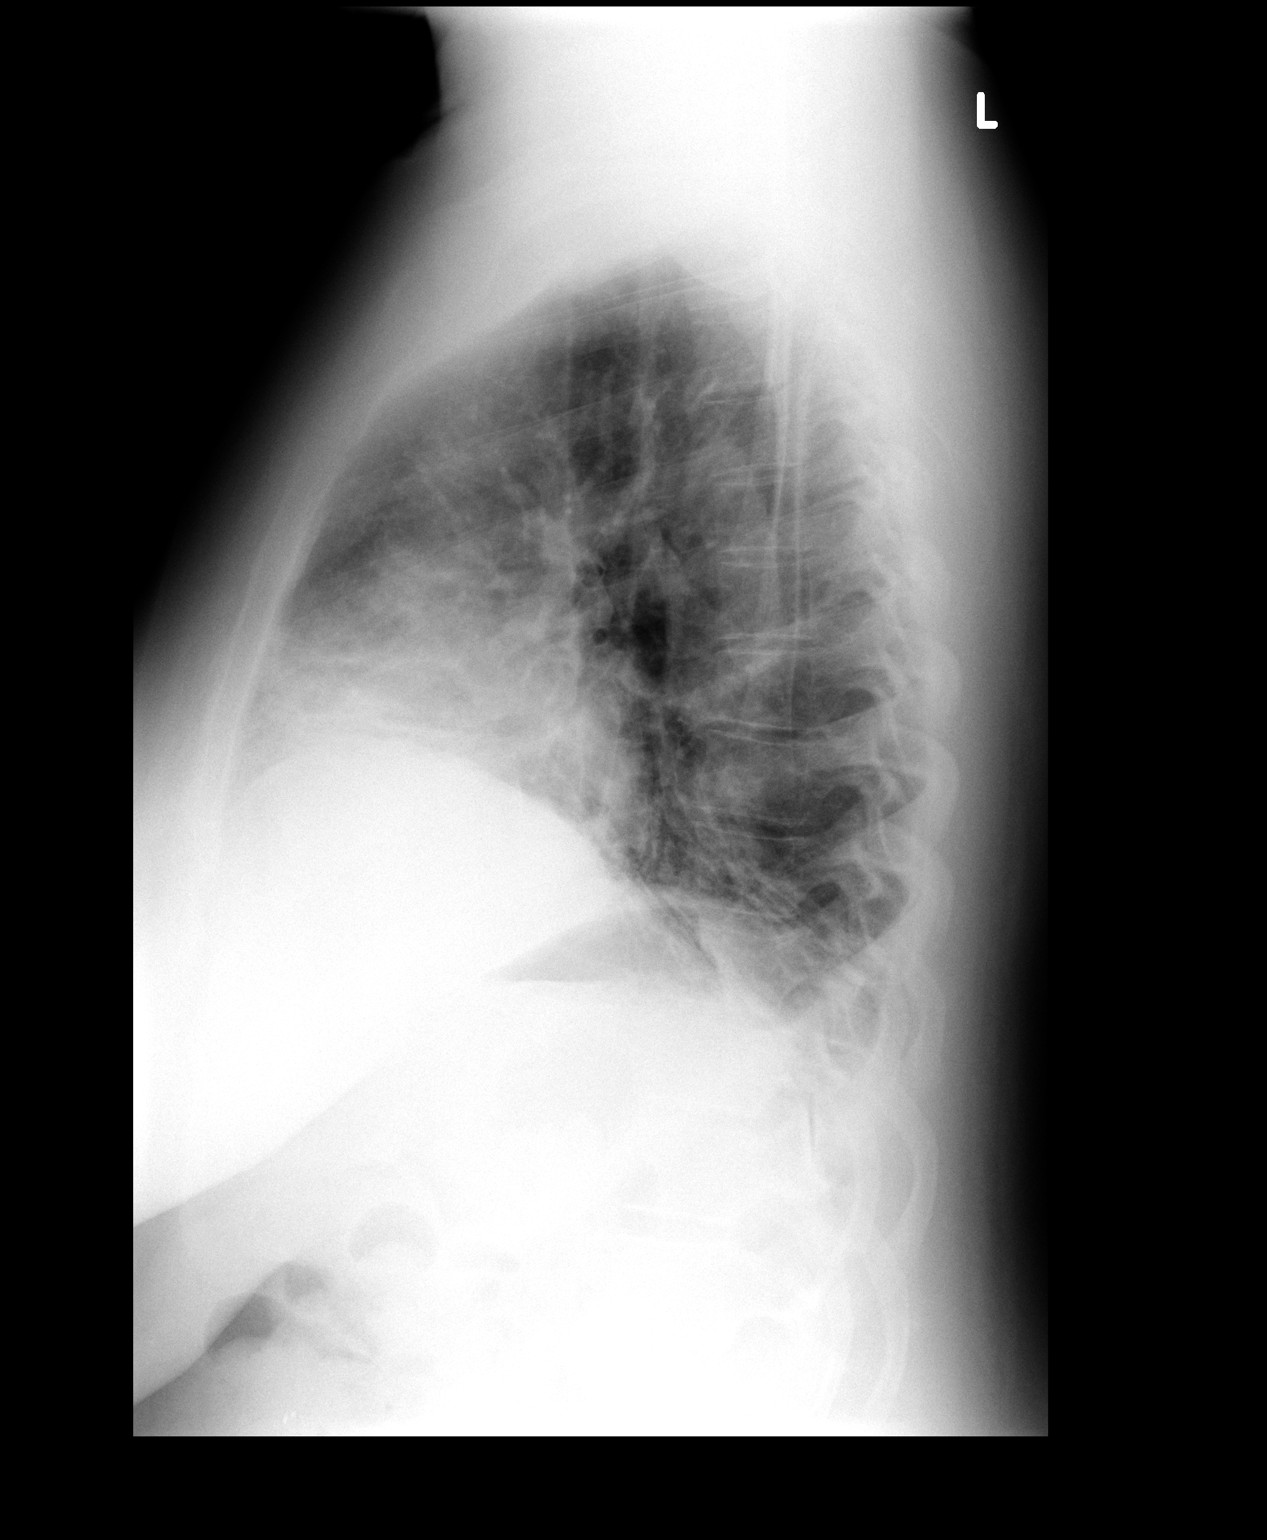

[3 of 3 positions shown; findings below may reference images not displayed]

FINDINGS: The patient has moderate linear bibasilar atelectasis
without consolidative infiltrates or effusions.  Heart size and
vascularity are normal.  Prominent peribronchial thickening.  No
osseous abnormality.
IMPRESSION: Bronchitic changes with moderate linear bibasilar atelectasis.

## 2014-04-25 ENCOUNTER — Ambulatory Visit (HOSPITAL_COMMUNITY)
Admission: RE | Admit: 2014-04-25 | Payer: Federal, State, Local not specified - Other | Source: Home / Self Care | Admitting: Psychiatry

## 2019-06-13 ENCOUNTER — Other Ambulatory Visit (HOSPITAL_COMMUNITY): Payer: Self-pay

## 2019-06-13 DIAGNOSIS — R0601 Orthopnea: Secondary | ICD-10-CM

## 2019-06-19 ENCOUNTER — Other Ambulatory Visit (HOSPITAL_COMMUNITY): Payer: Self-pay

## 2019-06-19 ENCOUNTER — Other Ambulatory Visit (HOSPITAL_COMMUNITY): Payer: Self-pay | Admitting: Internal Medicine

## 2019-06-19 ENCOUNTER — Ambulatory Visit (HOSPITAL_COMMUNITY)
Admission: RE | Admit: 2019-06-19 | Discharge: 2019-06-19 | Disposition: A | Discharge status: Home / Self Care | Payer: Self-pay | Source: Ambulatory Visit | Attending: Internal Medicine | Admitting: Internal Medicine

## 2019-06-19 ENCOUNTER — Ambulatory Visit (HOSPITAL_COMMUNITY): Payer: Self-pay | Attending: Internal Medicine

## 2019-06-19 ENCOUNTER — Other Ambulatory Visit: Payer: Self-pay

## 2019-06-19 ENCOUNTER — Encounter (HOSPITAL_COMMUNITY): Payer: Self-pay

## 2019-06-19 DIAGNOSIS — R0602 Shortness of breath: Secondary | ICD-10-CM

## 2019-06-19 DIAGNOSIS — R05 Cough: Secondary | ICD-10-CM

## 2019-06-19 DIAGNOSIS — R059 Cough, unspecified: Secondary | ICD-10-CM

## 2019-06-19 DIAGNOSIS — R918 Other nonspecific abnormal finding of lung field: Secondary | ICD-10-CM | POA: Insufficient documentation

## 2019-06-19 DIAGNOSIS — R062 Wheezing: Secondary | ICD-10-CM

## 2019-06-19 DIAGNOSIS — R0601 Orthopnea: Secondary | ICD-10-CM

## 2019-06-19 DIAGNOSIS — E119 Type 2 diabetes mellitus without complications: Secondary | ICD-10-CM | POA: Insufficient documentation

## 2019-06-19 DIAGNOSIS — I119 Hypertensive heart disease without heart failure: Secondary | ICD-10-CM | POA: Insufficient documentation

## 2019-06-19 NOTE — Progress Notes (Signed)
Echocardiogram 2D Echocardiogram has been performed.  Andrea [name] 06/19/2019, 10:49 AM

## 2019-10-25 ENCOUNTER — Encounter (HOSPITAL_BASED_OUTPATIENT_CLINIC_OR_DEPARTMENT_OTHER): Payer: Self-pay | Admitting: Emergency Medicine

## 2019-10-25 ENCOUNTER — Emergency Department (HOSPITAL_BASED_OUTPATIENT_CLINIC_OR_DEPARTMENT_OTHER)
Admission: EM | Admit: 2019-10-25 | Discharge: 2019-10-25 | Disposition: A | Discharge status: Home / Self Care | Payer: No Typology Code available for payment source | Attending: Emergency Medicine | Admitting: Emergency Medicine

## 2019-10-25 ENCOUNTER — Other Ambulatory Visit: Payer: Self-pay

## 2019-10-25 ENCOUNTER — Emergency Department (HOSPITAL_BASED_OUTPATIENT_CLINIC_OR_DEPARTMENT_OTHER): Payer: No Typology Code available for payment source

## 2019-10-25 DIAGNOSIS — I10 Essential (primary) hypertension: Secondary | ICD-10-CM | POA: Insufficient documentation

## 2019-10-25 DIAGNOSIS — E119 Type 2 diabetes mellitus without complications: Secondary | ICD-10-CM | POA: Diagnosis not present

## 2019-10-25 DIAGNOSIS — Y939 Activity, unspecified: Secondary | ICD-10-CM | POA: Diagnosis not present

## 2019-10-25 DIAGNOSIS — Z79899 Other long term (current) drug therapy: Secondary | ICD-10-CM | POA: Diagnosis not present

## 2019-10-25 DIAGNOSIS — Y92410 Unspecified street and highway as the place of occurrence of the external cause: Secondary | ICD-10-CM | POA: Insufficient documentation

## 2019-10-25 DIAGNOSIS — Y999 Unspecified external cause status: Secondary | ICD-10-CM | POA: Diagnosis not present

## 2019-10-25 DIAGNOSIS — M79602 Pain in left arm: Secondary | ICD-10-CM | POA: Diagnosis present

## 2019-10-25 DIAGNOSIS — M79605 Pain in left leg: Secondary | ICD-10-CM | POA: Insufficient documentation

## 2019-10-25 MED ORDER — ACETAMINOPHEN 325 MG PO TABS
650.0000 mg | ORAL_TABLET | Freq: Once | ORAL | Status: AC
  Administered 2019-10-25: 650 mg via ORAL
  Filled 2019-10-25: qty 2

## 2019-10-25 MED ORDER — CYCLOBENZAPRINE HCL 10 MG PO TABS: 10.0000 mg | ORAL_TABLET | Freq: Two times a day (BID) | ORAL | 0 refills | Status: DC | PRN

## 2019-10-25 MED ORDER — LIDOCAINE 5 % EX PTCH
1.0000 | MEDICATED_PATCH | Freq: Once | CUTANEOUS | Status: DC
  Administered 2019-10-25: 1 via TRANSDERMAL
  Filled 2019-10-25: qty 1

## 2019-10-25 MED ORDER — IBUPROFEN 400 MG PO TABS
600.0000 mg | ORAL_TABLET | Freq: Once | ORAL | Status: AC
  Administered 2019-10-25: 600 mg via ORAL
  Filled 2019-10-25: qty 1
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
## 2019-11-12 ENCOUNTER — Other Ambulatory Visit (HOSPITAL_COMMUNITY): Payer: Self-pay | Admitting: Physician Assistant

## 2020-01-24 ENCOUNTER — Other Ambulatory Visit (HOSPITAL_COMMUNITY): Payer: Self-pay | Admitting: Physician Assistant

## 2020-01-24 MED FILL — oxyCODONE HCL 15 MG TABS: 15 | 3 days supply | Qty: 12 | Fill #0

## 2020-01-29 ENCOUNTER — Other Ambulatory Visit (HOSPITAL_COMMUNITY): Payer: Self-pay | Admitting: Physician Assistant

## 2020-01-29 MED FILL — oxyCODONE HCL 15 MG TABS: 15 | 30 days supply | Qty: 120 | Fill #0

## 2020-01-29 MED FILL — BENZONATATE 200 MG CAP: 200 | 10 days supply | Qty: 30 | Fill #0

## 2020-01-29 MED FILL — AZITHROMYCIN 500 MG TABS: 500 | 6 days supply | Qty: 6 | Fill #0

## 2020-02-04 MED FILL — IVERMECTIN 3 MG TABLET: 3 | 1 days supply | Qty: 6 | Fill #0

## 2020-02-10 ENCOUNTER — Other Ambulatory Visit (HOSPITAL_COMMUNITY): Payer: Self-pay | Admitting: Physician Assistant

## 2020-02-11 MED FILL — DEXAMETHASONE 6 MG TABLET: 6 | 5 days supply | Qty: 5 | Fill #0

## 2020-02-17 MED FILL — predniSONE 5 MG (21) TBPK: 5 | 6 days supply | Qty: 21 | Fill #0

## 2020-02-19 ENCOUNTER — Other Ambulatory Visit (HOSPITAL_COMMUNITY): Payer: Self-pay | Admitting: Physician Assistant

## 2020-02-19 MED FILL — FLUCONAZOLE 100 MG TAB: 100 | 14 days supply | Qty: 15 | Fill #0

## 2020-02-24 ENCOUNTER — Emergency Department (HOSPITAL_COMMUNITY): Payer: HRSA Program

## 2020-02-24 ENCOUNTER — Encounter (HOSPITAL_COMMUNITY): Payer: Self-pay

## 2020-02-24 ENCOUNTER — Other Ambulatory Visit: Payer: Self-pay

## 2020-02-24 ENCOUNTER — Emergency Department (HOSPITAL_COMMUNITY)
Admission: EM | Admit: 2020-02-24 | Discharge: 2020-02-24 | Disposition: A | Discharge status: Home / Self Care | Payer: HRSA Program | Attending: Emergency Medicine | Admitting: Emergency Medicine

## 2020-02-24 DIAGNOSIS — Z79899 Other long term (current) drug therapy: Secondary | ICD-10-CM | POA: Diagnosis not present

## 2020-02-24 DIAGNOSIS — I10 Essential (primary) hypertension: Secondary | ICD-10-CM | POA: Diagnosis not present

## 2020-02-24 DIAGNOSIS — E1165 Type 2 diabetes mellitus with hyperglycemia: Secondary | ICD-10-CM | POA: Insufficient documentation

## 2020-02-24 DIAGNOSIS — Z7984 Long term (current) use of oral hypoglycemic drugs: Secondary | ICD-10-CM | POA: Diagnosis not present

## 2020-02-24 DIAGNOSIS — R739 Hyperglycemia, unspecified: Secondary | ICD-10-CM

## 2020-02-24 DIAGNOSIS — U071 COVID-19: Secondary | ICD-10-CM | POA: Insufficient documentation

## 2020-02-24 LAB — URINALYSIS, ROUTINE W REFLEX MICROSCOPIC
Bilirubin Urine: NEGATIVE
Glucose, UA: >=500 mg/dL — AB
Hgb urine dipstick: NEGATIVE
Ketones, ur: 5 mg/dL — AB
Leukocytes,Ua: NEGATIVE
Nitrite: NEGATIVE
Protein, ur: NEGATIVE mg/dL
Specific Gravity, Urine: 1.032 — ABNORMAL HIGH (ref 1.005–1.030)
pH: 6 (ref 5.0–8.0)

## 2020-02-24 LAB — CBC WITH DIFFERENTIAL/PLATELET
Abs Immature Granulocytes: 0.05 10*3/uL (ref 0.00–0.07)
Basophils Absolute: 0 10*3/uL (ref 0.0–0.1)
Basophils Relative: 0 %
Eosinophils Absolute: 0 10*3/uL (ref 0.0–0.5)
Eosinophils Relative: 0 %
HCT: 46.6 % — ABNORMAL HIGH (ref 36.0–46.0)
Hemoglobin: 14.6 g/dL (ref 12.0–15.0)
Immature Granulocytes: 0 %
Lymphocytes Relative: 24 %
Lymphs Abs: 2.9 10*3/uL (ref 0.7–4.0)
MCH: 26.4 pg (ref 26.0–34.0)
MCHC: 31.3 g/dL (ref 30.0–36.0)
MCV: 84.1 fL (ref 80.0–100.0)
Monocytes Absolute: 0.9 10*3/uL (ref 0.1–1.0)
Monocytes Relative: 7 %
Neutro Abs: 8.3 10*3/uL — ABNORMAL HIGH (ref 1.7–7.7)
Neutrophils Relative %: 69 %
Platelets: 202 10*3/uL (ref 150–400)
RBC: 5.54 MIL/uL — ABNORMAL HIGH (ref 3.87–5.11)
RDW: 13.9 % (ref 11.5–15.5)
WBC: 12.2 10*3/uL — ABNORMAL HIGH (ref 4.0–10.5)
nRBC: 0 % (ref 0.0–0.2)

## 2020-02-24 LAB — BASIC METABOLIC PANEL
Anion gap: 15 (ref 5–15)
BUN: 20 mg/dL (ref 6–20)
CO2: 23 mmol/L (ref 22–32)
Calcium: 9.6 mg/dL (ref 8.9–10.3)
Chloride: 91 mmol/L — ABNORMAL LOW (ref 98–111)
Creatinine, Ser: 1.11 mg/dL — ABNORMAL HIGH (ref 0.44–1.00)
GFR, Estimated: >60 mL/min (ref >60)
Glucose, Bld: 773 mg/dL (ref 70–99)
Potassium: 5 mmol/L (ref 3.5–5.1)
Sodium: 129 mmol/L — ABNORMAL LOW (ref 135–145)

## 2020-02-24 LAB — CBG MONITORING, ED: Glucose-Capillary: 381 mg/dL — ABNORMAL HIGH (ref 70–99)

## 2020-02-24 MED ORDER — ONDANSETRON HCL 4 MG/2ML IJ SOLN
4.0000 mg | Freq: Once | INTRAMUSCULAR | Status: AC
  Administered 2020-02-24: 4 mg via INTRAVENOUS
  Filled 2020-02-24: qty 2

## 2020-02-24 MED ORDER — INSULIN ASPART 100 UNIT/ML ~~LOC~~ SOLN
6.0000 [IU] | Freq: Once | SUBCUTANEOUS | Status: AC
  Administered 2020-02-24: 6 [IU] via INTRAVENOUS
  Filled 2020-02-24: qty 0.06

## 2020-02-24 MED ORDER — SODIUM CHLORIDE 0.9 % IV BOLUS
1000.0000 mL | Freq: Once | INTRAVENOUS | Status: AC
  Administered 2020-02-24: 1000 mL via INTRAVENOUS

## 2020-02-24 NOTE — ED Notes (Signed)
Patient drank 1 cup of water without emesis. Patient is coughing periodically.

## 2020-02-24 NOTE — ED Notes (Signed)
An After Visit Summary was printed and given to the patient. Discharge instructions given and no further questions at this time.  

## 2020-02-24 NOTE — ED Provider Notes (Signed)
[facility]-EMERGENCY DEPT Provider Note   CSN: [id number] Arrival date & time: 02/24/20  0944     History Chief Complaint  Patient presents with  . Covid Positive  . Cough  . Sore Throat  . Urinary Frequency    Andrea [name] is a 50 y.o. female has been with history of diabetes, hypertension, lupus presents for evaluation of persistent cough, nausea/vomiting/diarrhea, fatigue.  She reports that she first tested positive for COVID-19 on 01/29/2020.  She reports that she has continued to test positive and states her last positive test was 02/21/2020.  She reports that she has continued to have cough that is productive of phlegm.  She states that she has had fatigue.  She has not had any fevers.  She states she has had nausea/vomiting/diarrhea.  She states that initially, she was having some posttussive emesis but now she has not been able to tolerate much p.o. over the last several days.  She states she is able to get down a few sips of water and some Ensure but otherwise will have vomiting.  She reports a few episodes of diarrhea a day.  She also reports she has been having some increased urinary frequency.  No other urinary complaints.  She reports she was on a prednisone taper but she has now finished that.  She denies any abdominal pain, difficulty breathing.  The history is provided by the patient.       Past Medical History:  Diagnosis Date  . Diabetes mellitus without complication (HCC)   . Hypertension   . Lupus [facility])     Patient Active Problem List   Diagnosis Date Noted  . Cough 01/17/2012  . ILD (interstitial lung disease) (HCC) 01/17/2012  . High risk medication use 01/17/2012  . CAP (community acquired pneumonia) 12/18/2011  . Leukocytosis 12/18/2011  . HTN (hypertension) 12/18/2011  . Diabetes mellitus (HCC) 12/18/2011  . Lupus (HCC) 12/18/2011    Past Surgical History:  Procedure Laterality Date  . NO PAST SURGERIES    . VIDEO  BRONCHOSCOPY  01/17/2012   Procedure: VIDEO BRONCHOSCOPY WITH FLUORO;  Surgeon: Kalman Shan, MD;  Location: [facility] ENDOSCOPY;  Service: Endoscopy;  Laterality: Bilateral;     OB History   No obstetric history on file.     Family History  Problem Relation Age of Onset  . Heart attack Father        deceased  . Lupus Mother        deceased    Social History   Tobacco Use  . Smoking status: Never Smoker  . Smokeless tobacco: Never Used  Vaping Use  . Vaping Use: Never used  Substance Use Topics  . Alcohol use: Yes    Alcohol/week: 2.0 standard drinks    Types: 2 Glasses of wine per week  . Drug use: No    Home Medications Prior to Admission medications   Medication Sig Start Date End Date Taking? Authorizing Provider  ALPRAZolam (XANAX) 0.25 MG tablet Take 0.25 mg by mouth 3 (three) times daily as needed. anxiety    [provider]  benzonatate (TESSALON) 100 MG capsule Take 1 capsule (100 mg total) by mouth 3 (three) times daily as needed for cough. 07/12/12   Parrett, Virgel Bouquet, NP  cyclobenzaprine (FLEXERIL) 10 MG tablet Take 1 tablet (10 mg total) by mouth 2 (two) times daily as needed for muscle spasms. 10/25/19   Walisiewicz, Yvonna Alanis E, PA-C  gabapentin (NEURONTIN) 300 MG capsule 300mg  once  daily x 3 days, then 300mg  twice daily x 3 days, then 300mg  three times daily to continue 01/18/12   , MD  hydroxychloroquine (PLAQUENIL) 200 MG tablet Take by mouth daily.    [provider]  metFORMIN (FORTAMET) 1000 MG (OSM) 24 hr tablet Take 1 tablet (1,000 mg total) by mouth daily with breakfast. 12/20/11   Kalman Shan, MD  metoprolol (LOPRESSOR) 50 MG tablet Take 50 mg by mouth 2 (two) times daily.      [provider]  promethazine-codeine (PHENERGAN WITH CODEINE) 6.25-10 MG/5ML syrup Take 5 mLs by mouth every 4 (four) hours as needed for cough. 01/26/12   Parrett, 08-23-1977, NP    Allergies    Patient has no known allergies.  Review  of Systems   Review of Systems  Constitutional: Positive for fatigue. Negative for fever.  Respiratory: Positive for cough. Negative for shortness of breath.   Cardiovascular: Negative for chest pain.  Gastrointestinal: Positive for diarrhea, nausea and vomiting. Negative for abdominal pain.  Genitourinary: Positive for frequency. Negative for dysuria and hematuria.  Neurological: Negative for headaches.  All other systems reviewed and are negative.   Physical Exam Updated Vital Signs BP 121/78   Pulse 91   Temp 97.9 F (36.6 C) (Oral)   Resp 18   Ht 5\' 5"  (1.651 m)   Wt 109.4 kg   LMP 09/29/2010   SpO2 98%   BMI 40.15 kg/m   Physical Exam Vitals and nursing note reviewed.  Constitutional:      Appearance: Normal appearance. She is well-developed and well-nourished.  HENT:     Head: Normocephalic and atraumatic.     Mouth/Throat:     Mouth: Oropharynx is clear and moist and mucous membranes are normal.  Eyes:     General: Lids are normal.     Extraocular Movements: EOM normal.     Conjunctiva/sclera: Conjunctivae normal.     Pupils: Pupils are equal, round, and reactive to light.  Cardiovascular:     Rate and Rhythm: Normal rate and regular rhythm.     Pulses: Normal pulses.     Heart sounds: Normal heart sounds. No murmur heard. No friction rub. No gallop.   Pulmonary:     Effort: Pulmonary effort is normal.     Breath sounds: Normal breath sounds.     Comments: Lungs clear to auscultation bilaterally.  Symmetric chest rise.  No wheezing, rales, rhonchi. Able to speak in full sentences. No evidence of respiratory distress.  Abdominal:     Palpations: Abdomen is soft. Abdomen is not rigid.     Tenderness: There is no abdominal tenderness. There is no guarding.     Comments: Abdomen is soft, non-distended, non-tender. No rigidity, No guarding. No peritoneal signs.  Musculoskeletal:        General: Normal range of motion.     Cervical back: Full passive range of  motion without pain.  Skin:    General: Skin is warm and dry.     Capillary Refill: Capillary refill takes less than 2 seconds.  Neurological:     Mental Status: She is alert and oriented to person, place, and time.  Psychiatric:        Mood and Affect: Mood and affect normal.        Speech: Speech normal.     ED Results / Procedures / Treatments   Labs (all labs ordered are listed, but only abnormal results are displayed) Labs Reviewed  URINALYSIS,  ROUTINE W REFLEX MICROSCOPIC - Abnormal; Notable for the following components:      Result Value   Color, Urine STRAW (*)    Specific Gravity, Urine 1.032 (*)    Glucose, UA >=500 (*)    Ketones, ur 5 (*)    Bacteria, UA RARE (*)    All other components within normal limits  BASIC METABOLIC PANEL - Abnormal; Notable for the following components:   Sodium 129 (*)    Chloride 91 (*)    Glucose, Bld 773 (*)    Creatinine, Ser 1.11 (*)    All other components within normal limits  CBC WITH DIFFERENTIAL/PLATELET - Abnormal; Notable for the following components:   WBC 12.2 (*)    RBC 5.54 (*)    HCT 46.6 (*)    Neutro Abs 8.3 (*)    All other components within normal limits  CBG MONITORING, ED - Abnormal; Notable for the following components:   Glucose-Capillary 381 (*)    All other components within normal limits    EKG None  Radiology DG Chest Portable 1 View  Result Date: 02/24/2020 CLINICAL DATA:  COVID positive on 01/29/2020 and positive again on 02/21/2020. Cough and sore throat. EXAM: PORTABLE CHEST 1 VIEW COMPARISON:  06/19/2019 and CT chest 12/18/2011. FINDINGS: Trachea is midline. Heart size stable. Mild left perihilar and bibasilar pulmonary parenchymal scarring. Lungs are low in volume but otherwise clear. No pleural fluid. IMPRESSION: 1. No acute findings. 2. Pulmonary parenchymal scarring or fibrosis. Electronically Signed   By: Leanna BattlesMelinda  Blietz M.D.   On: 02/24/2020 13:21    Procedures Procedures (including  critical care time)  Medications Ordered in ED Medications  sodium chloride 0.9 % bolus 1,000 mL (0 mLs Intravenous Stopped 02/24/20 1404)  ondansetron (ZOFRAN) injection 4 mg (4 mg Intravenous Given 02/24/20 1309)  insulin aspart (novoLOG) injection 6 Units (6 Units Intravenous Given 02/24/20 1409)  sodium chloride 0.9 % bolus 1,000 mL (0 mLs Intravenous Stopped 02/24/20 1527)    ED Course  I have reviewed the triage vital signs and the nursing notes.  Pertinent labs & imaging results that were available during my care of the patient were reviewed by me and considered in my medical decision making (see chart for details).    MDM Rules/Calculators/A&P                          50 year old female with past history of diabetes, lupus who presents for evaluation of generalized weakness, fatigue, and cough.  She reports she tested positive for COVID on 01/29/2020.  She reports she is still been symptomatic and had a recent test on 02/21/2019 was still positive.  Patient states that she has been taking prednisone.  She finished the taper yesterday.  She comes today because she is still feeling weak.  She reports she has had some nausea/vomiting as well.  No fevers.  On initial arrival, she is afebrile, nontoxic-appearing.  She is slightly tachycardic.  Vitals otherwise stable.  Lungs clear to auscultation.  No evidence of respiratory distress.  We will plan to check basic labs given history of nausea/vomiting/diarrhea.  Will give fluids.  BMP shows glucose of 73.  BUN/creatinine within normal limits.  Bicarb is normal.  Anion gap is 15.  CBC shows slight leukocytosis at 12.2.  UA shows no infection etiology.  She does have glucosuria as well as very small ketones.  At this time, her work-up is not consistent with DKA.  Patient given 2 L of fluid as well as insulin and we will plan to recheck her blood sugar.  Repeat blood sugar is 381.  Chest x-ray negative for any infectious etiology.  Patient is  hemodynamically stable.  She is not hypoxic.  At this time, do not feel that patient warrants admission for COVID-19 as she has no oxygen requirement.  Additionally, history/physical exam not concerning for DKA, HHS.  I suspect her blood sugar is elevated from her recent prednisone use. At this time, patient exhibits no emergent life-threatening condition that require further evaluation in ED. Patient had ample opportunity for questions and discussion. All patient's questions were answered with full understanding. Strict return precautions discussed. Patient expresses understanding and agreement to plan.   Portions of this note were generated with Scientist, clinical (histocompatibility and immunogenetics). Dictation errors may occur despite best attempts at proofreading.   Final Clinical Impression(s) / ED Diagnoses Final diagnoses:  Hyperglycemia  COVID-19    Rx / DC Orders ED Discharge Orders    None       Rosana Hoes 02/24/20 1812    Charlynne Pander, MD 02/25/20 [phone]

## 2020-02-24 NOTE — ED Notes (Signed)
Date and time results received: 02/24/20 2:28 PM  (use smartphrase ".now" to insert current time)  Test: Gluc Critical Value: 773  Name of Provider Notified: Mardella Layman PA  Orders Received? Or Actions Taken?

## 2020-02-24 NOTE — Discharge Instructions (Addendum)
As we discussed, blood sugar was high today.  This could be from your recent prednisone use.  Is very important that you monitor your sugar intake as well as taking your metformin.  Additionally, we will have you follow-up with the post-COVID care clinic given your continued COVID symptoms.  Please call them and arrange for follow-up appointment.  Return emergency department for any difficulty breathing, inability eat or drink anything, chest pain or any other worsening concerning symptoms.

## 2020-02-24 NOTE — ED Triage Notes (Addendum)
Patient states she was Covid + on 01/29/20 and tested again on 02/21/20 and was positive. Patient continues to have a cough and sore throat.  Patient c/o urinary frequency x 1 month.

## 2020-02-25 ENCOUNTER — Other Ambulatory Visit (HOSPITAL_COMMUNITY): Payer: Self-pay | Admitting: Physician Assistant

## 2020-02-25 MED FILL — oxyCODONE HCL 15 MG TABS: 15 | 30 days supply | Qty: 120 | Fill #0

## 2020-02-26 ENCOUNTER — Ambulatory Visit (INDEPENDENT_AMBULATORY_CARE_PROVIDER_SITE_OTHER): Payer: HRSA Program | Admitting: Internal Medicine

## 2020-02-26 VITALS — BP 126/84 | HR 72

## 2020-02-26 DIAGNOSIS — E1321 Other specified diabetes mellitus with diabetic nephropathy: Secondary | ICD-10-CM

## 2020-02-26 DIAGNOSIS — J849 Interstitial pulmonary disease, unspecified: Secondary | ICD-10-CM

## 2020-02-26 DIAGNOSIS — U071 COVID-19: Secondary | ICD-10-CM | POA: Diagnosis not present

## 2020-02-26 NOTE — Progress Notes (Signed)
CMA intake history: Symptoms:  Coughing w/ clear phlegm Vomiting Sneezing Diarrhea Pt states when she coughs her lower back hurts   [facility] note: Symptoms began 01/24/2020 as increased cough and thirst.  COVID screening was positive on 01/29/2020.  She was prescribed Decadron and ivermectin at that time. She has had 1 vaccine to date. She did improve approximately 50% by history but the cough has persisted and is associated with shortness of breath.  She has produced scant clear sputum.  With the cough she will experience chest discomfort.  She describes insomnia and also change in vision.  Other symptoms include chills, anosmia, nasal congestion with postnasal drainage, sore throat, and altered taste.  She is also had isolated bouts of vomiting.  Additional symptoms include headache, dizziness, and confusion. Repeat COVID screen was positive on 02/21/2020. Because of the persistent symptoms she went to the ED  02/24/2020 with complaints of cough, sore throat, fatigue, diarrhea, and nausea and vomiting. She had not been able to tolerate significant oral intake over the several few days prior to the ED visit even though the emesis had improved. She had been drinking liquids and taking Ensure. Additionally she has had some urinary frequency. She has significant past medical history of lupus as well as interstitial lung disease. Imaging revealed scarring or fibrosis in the perihilar areas and bibasilar areas which was stable compared to 06/19/2019 and prior CT scans. White blood count was 12,200 in the context of supplemental steroids: The steroid taper has been completed. Glucose was 773 which dropped to 380 with insulin.   Significant medical comorbidities present include: Interstitial lung disease, OSA, essential hypertension, lupus, hypertension, and diabetes   No PMH of MI, stroke,  chronic liver disease, emphysema, asthma, history of cancer, dyslipidemia,  or smoking   Positive  review of systems for Covid infection are documented above in HPI. Not present are: Constitutional: Fever, fatigue, chills HEENT: Eye redness and discharge (conjunctivitis), nasal congestion, sore throat, anosmia, and altered taste Pulmonary: tachypnea, tachycardia, hemoptysis Genitourinary: Oliguria or anuria Skeletal: Myalgias Dermatologic: Rash  Physical exam:  Pertinent or positive findings: Nares are markedly dry.  She exhibits a slight tachycardia.  Minimal rales are noted on exam.  She has 1/2+ edema.  Pedal pulses are decreased.  General appearance: Adequately nourished; no acute distress, increased work of breathing is present.   Lymphatic: No lymphadenopathy about the head, neck, axilla. Eyes: No conjunctival inflammation or lid edema is present. There is no scleral icterus. Ears:  External ear exam shows no significant lesions or deformities.   Nose:  External nasal examination shows no deformity or inflammation. Oral exam:  Lips and gums are healthy appearing. There is no oropharyngeal erythema or exudate. Neck:  No thyromegaly, masses, tenderness noted.    Heart:  No gallop, murmur, click, rub .  Lungs:  without wheezes, rhonchi,  rubs. Abdomen: Bowel sounds are normal. Abdomen is soft and nontender with no organomegaly, hernias, masses. GU: Deferred  Extremities:  No cyanosis, clubbing  Skin: Warm & dry w/o tenting. No significant lesions or rash.  See assessment & plan under each active problem in the Problem List & acutely for this visit

## 2020-02-26 NOTE — Patient Instructions (Addendum)
Most importantly continue to practice the "3 Ws": #1 Wear A Mask;#2 Wash Hands;#3 Wait 6 Feet Apart (Social Distancing) ! Nasal hygiene maneuvers & Flonase are recommended as discussed. Zicam ( Zinc lozenges)  5X a day for 5 days. Take Tylenol every 4 hours as needed if having fever. To protect the liver, the total dose should not exceed 4000 mg total per day (less than EIGHT 500 mg tablets in 24 hour period). Active COVID infection treatment options : High risk (50 years or older with @ least 1 health issue including pulmonary disease, diabetes, kidney disease): #1 Referral for Remdesivir infusion X 3 days or monoclonal antibody infusion (please take complete medication & supplement list to monoclonal antibody clinic visit as specific meds may affect dose for certain antibody infusions) or antiviral pill . COVID Clinic specialist will determine  best therapy. #2 zinc therapy as noted above Current clinical trials do not show definite benefit for hydroxychloroquine, ivermectin, colchicine, fluoxetine, or vitamin supplements.

## 2020-02-27 DIAGNOSIS — U071 COVID-19: Secondary | ICD-10-CM | POA: Insufficient documentation

## 2020-02-27 NOTE — Assessment & Plan Note (Addendum)
02/24/2020 revealed perihilar and bibasilar scarring or fibrosis stable compared to chest x-ray 06/19/2019 and prior CT scans. O2 sats are actually good at this time on room air.

## 2020-02-27 NOTE — Assessment & Plan Note (Addendum)
NAA COVID screen through [facility].  Nasal hygiene, fluticasone, and oral zinc as symptomatic treatment.  Ambulatory Referral for COVID Treatment option consideration ( monoclonal antibodies, oral antiviral pills or Remdesivir infusion) due to co-morbidities.

## 2020-02-27 NOTE — Assessment & Plan Note (Addendum)
Severe exacerbation of hyperglycemia on steroid taper with glucoses of 773 in the ED 1/10.  Treated with insulin bolus with glucose 380. 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  Diabetes also apparently associated with peripheral neuropathy as she has been on gabapentin. PCP will be contacted to manage her diabetes.  It is anticipated that she will need insulin until the severe hyperglycemia exacerbated by steroids resolves.  As she does not have significant CKD basal insulin may be the simplest approach. The last A1c on record was 7.3% in 2013.  The A1c should not be checked until the hyperglycemia is controlled for at least 8 weeks.

## 2020-02-29 LAB — SARS-COV-2, NAA 2 DAY TAT

## 2020-02-29 LAB — NOVEL CORONAVIRUS, NAA: SARS-CoV-2, NAA: NOT DETECTED

## 2020-03-26 ENCOUNTER — Other Ambulatory Visit (HOSPITAL_COMMUNITY): Payer: Self-pay | Admitting: Physician Assistant

## 2020-03-26 MED FILL — AMLODIPINE BESYLATE 5 MG TA: 5 | 90 days supply | Qty: 90 | Fill #0

## 2020-03-26 MED FILL — METOPROLOL TARTRATE 25 MG T: 25 | 90 days supply | Qty: 180 | Fill #0

## 2020-03-26 MED FILL — BENZONATATE 200 MG CAP: 200 | 10 days supply | Qty: 30 | Fill #1

## 2020-03-26 MED FILL — oxyCODONE HCL 15 MG TABS: 15 | 30 days supply | Qty: 120 | Fill #0

## 2020-04-24 ENCOUNTER — Other Ambulatory Visit (HOSPITAL_COMMUNITY): Payer: Self-pay | Admitting: Physician Assistant

## 2020-04-24 MED FILL — oxyCODONE HCL 15 MG TABS: 15 | 30 days supply | Qty: 120 | Fill #0

## 2020-05-22 ENCOUNTER — Other Ambulatory Visit (HOSPITAL_COMMUNITY): Payer: Self-pay

## 2020-05-22 MED ORDER — BENZONATATE 200 MG PO CAPS
ORAL_CAPSULE | ORAL | 1 refills | Status: DC
Start: 2020-05-22 — End: 2022-08-27
  Filled 2020-05-22: qty 30, 10d supply, fill #0

## 2020-05-22 MED ORDER — OXYCODONE HCL 15 MG PO TABS
ORAL_TABLET | ORAL | 0 refills | Status: DC
Start: 2020-05-22 — End: 2020-06-19
  Filled 2020-05-22: qty 120, 30d supply, fill #0

## 2020-06-19 ENCOUNTER — Other Ambulatory Visit (HOSPITAL_COMMUNITY): Payer: Self-pay

## 2020-06-19 MED ORDER — OXYCODONE HCL 15 MG PO TABS
ORAL_TABLET | ORAL | 0 refills | Status: DC
Start: 2020-06-19 — End: 2020-07-16
  Filled 2020-06-19: qty 120, 30d supply, fill #0

## 2020-07-16 ENCOUNTER — Other Ambulatory Visit (HOSPITAL_COMMUNITY): Payer: Self-pay

## 2020-07-16 MED ORDER — OXYCODONE HCL 15 MG PO TABS
ORAL_TABLET | ORAL | 0 refills | Status: AC
Start: 2020-07-16 — End: ?
  Filled 2020-07-16 – 2020-07-18 (×2): qty 120, 30d supply, fill #0

## 2020-07-18 ENCOUNTER — Other Ambulatory Visit (HOSPITAL_COMMUNITY): Payer: Self-pay

## 2020-08-14 ENCOUNTER — Other Ambulatory Visit (HOSPITAL_COMMUNITY): Payer: Self-pay

## 2020-08-14 MED ORDER — BENZONATATE 200 MG PO CAPS
200.0000 mg | ORAL_CAPSULE | Freq: Three times a day (TID) | ORAL | 5 refills | Status: DC | PRN
Start: 2020-08-14 — End: 2022-08-27
  Filled 2020-08-14: qty 30, 10d supply, fill #0

## 2020-08-14 MED ORDER — OXYCODONE HCL 15 MG PO TABS
15.0000 mg | ORAL_TABLET | Freq: Four times a day (QID) | ORAL | 0 refills | Status: DC | PRN
  Filled 2020-08-14 (×2): qty 120, 30d supply, fill #0

## 2020-09-11 ENCOUNTER — Other Ambulatory Visit (HOSPITAL_COMMUNITY): Payer: Self-pay

## 2020-09-11 MED ORDER — FLUCONAZOLE 150 MG PO TABS
ORAL_TABLET | ORAL | 0 refills | Status: DC
  Filled 2020-09-11: qty 2, 5d supply, fill #0

## 2020-09-11 MED ORDER — OXYCODONE HCL 15 MG PO TABS
ORAL_TABLET | ORAL | 0 refills | Status: DC
  Filled 2020-09-11: qty 120, 30d supply, fill #0

## 2020-10-21 ENCOUNTER — Other Ambulatory Visit (HOSPITAL_COMMUNITY): Payer: Self-pay

## 2020-10-21 MED FILL — Amlodipine Besylate Tab 5 MG (Base Equivalent): ORAL | 90 days supply | Qty: 90 | Fill #0 | Status: AC

## 2020-11-10 ENCOUNTER — Other Ambulatory Visit (HOSPITAL_COMMUNITY): Payer: Self-pay

## 2020-11-10 MED ORDER — OXYCODONE HCL 15 MG PO TABS
ORAL_TABLET | ORAL | 0 refills | Status: DC
  Filled 2020-11-10: qty 120, 30d supply, fill #0

## 2020-11-11 ENCOUNTER — Other Ambulatory Visit (HOSPITAL_COMMUNITY): Payer: Self-pay

## 2020-12-09 ENCOUNTER — Other Ambulatory Visit (HOSPITAL_COMMUNITY): Payer: Self-pay

## 2020-12-09 MED ORDER — METOPROLOL TARTRATE 25 MG PO TABS
ORAL_TABLET | ORAL | 3 refills | Status: DC
  Filled 2020-12-09: qty 180, 90d supply, fill #0
  Filled 2021-04-26: qty 180, 90d supply, fill #1
  Filled 2021-07-24: qty 180, 90d supply, fill #2

## 2020-12-09 MED ORDER — OXYCODONE HCL 15 MG PO TABS
ORAL_TABLET | ORAL | 0 refills | Status: DC
  Filled 2020-12-09: qty 120, 30d supply, fill #0

## 2020-12-19 ENCOUNTER — Other Ambulatory Visit: Payer: Self-pay | Admitting: Physician Assistant

## 2020-12-19 ENCOUNTER — Other Ambulatory Visit: Payer: Self-pay

## 2020-12-19 ENCOUNTER — Ambulatory Visit
Admission: RE | Admit: 2020-12-19 | Discharge: 2020-12-19 | Disposition: A | Discharge status: Home / Self Care | Payer: 59 | Source: Ambulatory Visit | Attending: Physician Assistant | Admitting: Physician Assistant

## 2020-12-19 DIAGNOSIS — Z1231 Encounter for screening mammogram for malignant neoplasm of breast: Secondary | ICD-10-CM

## 2021-01-06 ENCOUNTER — Other Ambulatory Visit (HOSPITAL_COMMUNITY): Payer: Self-pay

## 2021-01-06 MED ORDER — BENZONATATE 200 MG PO CAPS
200.0000 mg | ORAL_CAPSULE | Freq: Three times a day (TID) | ORAL | 5 refills | Status: DC | PRN
  Filled 2021-01-06: qty 30, 10d supply, fill #0

## 2021-01-06 MED ORDER — OXYCODONE HCL 15 MG PO TABS
15.0000 mg | ORAL_TABLET | Freq: Four times a day (QID) | ORAL | 0 refills | Status: DC | PRN
  Filled 2021-01-06: qty 120, 30d supply, fill #0

## 2021-01-06 MED ORDER — AMLODIPINE BESYLATE 5 MG PO TABS
ORAL_TABLET | ORAL | 3 refills | Status: DC
  Filled 2021-01-06 – 2021-04-28 (×2): qty 90, 90d supply, fill #0
  Filled 2021-07-24: qty 90, 90d supply, fill #1

## 2021-01-06 MED ORDER — ALBUTEROL SULFATE HFA 108 (90 BASE) MCG/ACT IN AERS
INHALATION_SPRAY | RESPIRATORY_TRACT | 11 refills | Status: AC
  Filled 2021-01-06: qty 17, 60d supply, fill #0

## 2021-01-06 MED ORDER — PREDNISONE 5 MG (21) PO TBPK
ORAL_TABLET | ORAL | 2 refills | Status: DC
  Filled 2021-01-06: qty 21, 6d supply, fill #0

## 2021-01-06 MED ORDER — FLUCONAZOLE 150 MG PO TABS
150.0000 mg | ORAL_TABLET | ORAL | 2 refills | Status: DC
  Filled 2021-01-06 (×2): qty 2, 5d supply, fill #0

## 2021-01-06 MED ORDER — METOPROLOL TARTRATE 25 MG PO TABS
ORAL_TABLET | ORAL | 3 refills | Status: DC
  Filled 2021-01-06: qty 180, 90d supply, fill #0

## 2021-01-13 MED FILL — Amlodipine Besylate Tab 5 MG (Base Equivalent): ORAL | 90 days supply | Qty: 90 | Fill #1 | Status: AC

## 2021-01-14 ENCOUNTER — Other Ambulatory Visit (HOSPITAL_COMMUNITY): Payer: Self-pay

## 2021-02-05 ENCOUNTER — Other Ambulatory Visit (HOSPITAL_COMMUNITY): Payer: Self-pay

## 2021-02-05 MED ORDER — OXYCODONE HCL 5 MG PO TABS
ORAL_TABLET | ORAL | 0 refills | Status: DC
  Filled 2021-02-05: qty 28, 7d supply, fill #0

## 2021-02-05 MED ORDER — OXYCODONE HCL 10 MG PO TABS
ORAL_TABLET | ORAL | 0 refills | Status: DC
Start: 2021-02-05 — End: 2021-03-08
  Filled 2021-02-05: qty 120, 30d supply, fill #0

## 2021-03-08 ENCOUNTER — Other Ambulatory Visit (HOSPITAL_COMMUNITY): Payer: Self-pay

## 2021-03-08 MED ORDER — CELECOXIB 200 MG PO CAPS
200.0000 mg | ORAL_CAPSULE | Freq: Every day | ORAL | 3 refills | Status: DC
  Filled 2021-03-08: qty 30, 30d supply, fill #0

## 2021-03-08 MED ORDER — OXYCODONE HCL 10 MG PO TABS
ORAL_TABLET | ORAL | 0 refills | Status: DC
  Filled 2021-03-08: qty 120, 30d supply, fill #0

## 2021-03-08 MED ORDER — PREDNISONE 5 MG PO TABS
5.0000 mg | ORAL_TABLET | Freq: Every day | ORAL | 0 refills | Status: DC
  Filled 2021-03-08: qty 5, 5d supply, fill #0

## 2021-03-08 MED ORDER — AZITHROMYCIN 250 MG PO TABS
ORAL_TABLET | ORAL | 0 refills | Status: DC
  Filled 2021-03-08: qty 6, 5d supply, fill #0

## 2021-03-08 MED ORDER — PROMETHAZINE-DM 6.25-15 MG/5ML PO SYRP
ORAL_SOLUTION | ORAL | 0 refills | Status: DC
  Filled 2021-03-08: qty 120, 7d supply, fill #0
  Filled 2021-03-15: qty 120, 4d supply, fill #0

## 2021-03-15 ENCOUNTER — Other Ambulatory Visit (HOSPITAL_COMMUNITY): Payer: Self-pay

## 2021-03-16 ENCOUNTER — Other Ambulatory Visit (HOSPITAL_COMMUNITY): Payer: Self-pay

## 2021-03-22 ENCOUNTER — Other Ambulatory Visit (HOSPITAL_COMMUNITY): Payer: Self-pay

## 2021-04-06 ENCOUNTER — Other Ambulatory Visit (HOSPITAL_COMMUNITY): Payer: Self-pay

## 2021-04-06 MED ORDER — OXYCODONE HCL 15 MG PO TABS
ORAL_TABLET | ORAL | 0 refills | Status: DC
  Filled 2021-04-06: qty 120, 30d supply, fill #0

## 2021-04-06 MED ORDER — ACCU-CHEK FASTCLIX LANCET KIT: PACK | 6 refills | Status: AC

## 2021-04-06 MED ORDER — ACCU-CHEK AVIVA PLUS W/DEVICE KIT: PACK | 0 refills | Status: AC

## 2021-04-06 MED ORDER — ACCU-CHEK AVIVA PLUS VI STRP
ORAL_STRIP | 6 refills | Status: DC
  Filled 2021-04-06: qty 50, 15d supply, fill #0

## 2021-04-06 MED ORDER — METFORMIN HCL 500 MG PO TABS
ORAL_TABLET | ORAL | 3 refills | Status: DC
  Filled 2021-04-06: qty 60, 30d supply, fill #0

## 2021-04-26 ENCOUNTER — Other Ambulatory Visit (HOSPITAL_COMMUNITY): Payer: Self-pay

## 2021-04-28 ENCOUNTER — Other Ambulatory Visit (HOSPITAL_COMMUNITY): Payer: Self-pay

## 2021-05-04 ENCOUNTER — Other Ambulatory Visit (HOSPITAL_COMMUNITY): Payer: Self-pay

## 2021-05-04 MED ORDER — OXYCODONE HCL 15 MG PO TABS
ORAL_TABLET | ORAL | 0 refills | Status: DC
  Filled 2021-05-04: qty 120, 30d supply, fill #0

## 2021-05-04 MED ORDER — METFORMIN HCL 850 MG PO TABS
ORAL_TABLET | ORAL | 5 refills | Status: DC
  Filled 2021-05-04: qty 60, 30d supply, fill #0
  Filled 2021-06-08: qty 60, 30d supply, fill #1
  Filled 2021-07-24: qty 60, 30d supply, fill #2
  Filled 2021-09-15: qty 60, 30d supply, fill #3
  Filled 2021-12-29: qty 60, 30d supply, fill #4
  Filled 2022-02-28: qty 60, 30d supply, fill #5

## 2021-05-17 ENCOUNTER — Ambulatory Visit (INDEPENDENT_AMBULATORY_CARE_PROVIDER_SITE_OTHER): Payer: Self-pay | Admitting: Podiatrist

## 2021-05-17 DIAGNOSIS — E119 Type 2 diabetes mellitus without complications: Secondary | ICD-10-CM

## 2021-05-17 NOTE — Patient Instructions (Signed)

## 2021-05-20 ENCOUNTER — Encounter: Payer: Self-pay | Admitting: Podiatrist

## 2021-05-20 NOTE — Progress Notes (Signed)
?Chief Complaint  ?Patient presents with  ? Diabetes  ?  Foot exam   ?  ? ?HPI: Patient is 51 y.o. Andrea [name] who presents today for a diabetic foot examination.  She was diagnosed with diabetes about a month ago.  She is currently on Metformin and relates her blood sugar runs in the 200's range.  She relates no issues with her feet that she is aware of. She denies any numbness, tingling or pain in her legs or feet.  She relates her feet do feel cold.  She also has a diagnosis of Lupus.  ? ?Patient Active Problem List  ? Diagnosis Date Noted  ? COVID-19 virus infection 02/27/2020  ? Cough 01/17/2012  ? ILD (interstitial lung disease) ([place]) 01/17/2012  ? High risk medication use 01/17/2012  ? CAP (community acquired pneumonia) 12/18/2011  ? Leukocytosis 12/18/2011  ? HTN (hypertension) 12/18/2011  ? 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 Lupus ([place]) 12/18/2011  ? ? ?Current Outpatient Medications on File Prior to Visit  ?Medication Sig Dispense Refill  ? albuterol (VENTOLIN HFA) 108 (90 Base) MCG/ACT inhaler Inhale 1 - 2 puffs by mouth every 4 hours as needed 9 g 11  ? ALPRAZolam (XANAX) 0.25 MG tablet Take 0.25 mg by mouth 3 (three) times daily as needed. anxiety (Patient not taking: Reported on 02/26/2020)    ? amLODipine (NORVASC) 5 MG tablet Take 5 mg by mouth daily.    ? amLODipine (NORVASC) 5 MG tablet TAKE 1 TABLET BY MOUTH ONCE A DAY 90 tablet 3  ? amLODipine (NORVASC) 5 MG tablet Take 1 tablet (5 mg) by mouth once daily. 90 tablet 3  ? azithromycin (ZITHROMAX Z-PAK) 250 MG tablet Take 2 tablets by mouth on day 1, than take 1 tablet once daily on days 2-5. 6 each 0  ? benzonatate (TESSALON) 100 MG capsule Take 1 capsule (100 mg total) by mouth 3 (three) times daily as needed for cough. 40 capsule 3  ? benzonatate (TESSALON) 200 MG capsule take 1 capsule (200 mg) by oral route 3 times per day as needed for cough 30 capsule 1  ? benzonatate (TESSALON) 200 MG capsule Take 1 capsule (200  mg total) by mouth 3 (three) times daily as needed for cough 30 capsule 5  ? benzonatate (TESSALON) 200 MG capsule Take 1 capsule (200 mg total) by mouth 3 (three) times daily as needed for cough 30 capsule 5  ? Blood Glucose Monitoring Suppl (ACCU-CHEK AVIVA PLUS) w/Device KIT Use as directed 3 times a day 1 kit 0  ? celecoxib (CELEBREX) 200 MG capsule Take 1 capsule (200 mg total) by mouth daily. 30 capsule 3  ? cyclobenzaprine (FLEXERIL) 10 MG tablet Take 1 tablet (10 mg total) by mouth 2 (two) times daily as needed for muscle spasms. (Patient not taking: Reported on 02/26/2020) 10 tablet 0  ? fluconazole (DIFLUCAN) 150 MG tablet Take 1 tablet by mouth once. May repeat in 5 days if needed. 2 tablet 0  ? fluconazole (DIFLUCAN) 150 MG tablet Take 1 tablet (150 mg total) by mouth for 1 dose.  May repeat in 5 days if needed 2 tablet 2  ? gabapentin (NEURONTIN) 300 MG capsule $RemoveBe'300mg'pvwgFjNRj$  once daily x 3 days, then $RemoveBe'300mg'SfVJbmDBN$  twice daily x 3 days, then $RemoveBe'300mg'aUejkkjkX$  three times daily to continue (Patient not taking: Reported on 02/26/2020) 99 capsule 1  ? glucose blood (ACCU-CHEK AVIVA PLUS) test strip 3 times a day 50 each 6  ? hydroxychloroquine (  PLAQUENIL) 200 MG tablet Take by mouth daily. (Patient not taking: Reported on 02/26/2020)    ? Lancets Misc. (ACCU-CHEK FASTCLIX LANCET) KIT Use as directed 3 times a day 50 kit 6  ? metFORMIN (FORTAMET) 1000 MG (OSM) 24 hr tablet Take 1 tablet (1,000 mg total) by mouth daily with breakfast. (Patient not taking: Reported on 02/26/2020) 30 tablet 0  ? metFORMIN (GLUCOPHAGE) 500 MG tablet Take 1 tablet by mouth 2 times a day with meals 60 tablet 3  ? metFORMIN (GLUCOPHAGE) 850 MG tablet Take 1 tablet by mouth 2 times daily with a meal 60 tablet 5  ? metoprolol (LOPRESSOR) 50 MG tablet Take 50 mg by mouth 2 (two) times daily.    ? metoprolol tartrate (LOPRESSOR) 25 MG tablet TAKE 1 TABLET BY MOUTH 2 TIMES DAILY 180 tablet 3  ? metoprolol tartrate (LOPRESSOR) 25 MG tablet Take 1 tablet (25 mg) by  mouth 2 times daily. 180 tablet 3  ? metoprolol tartrate (LOPRESSOR) 25 MG tablet Take 1 tablet (25 mg) by oral route 2 times per day 180 tablet 3  ? oxyCODONE (OXY IR/ROXICODONE) 5 MG immediate release tablet Take 1 tablet by mouth every six hours as needed 28 tablet 0  ? oxyCODONE (ROXICODONE) 15 MG immediate release tablet Take 1 tablet (15 mg) by mouth every 6 hours as needed for pain 120 tablet 0  ? oxyCODONE (ROXICODONE) 15 MG immediate release tablet Take 1 tablet (15 mg total) by mouth every 6 (six) hours as needed for pain 120 tablet 0  ? oxyCODONE (ROXICODONE) 15 MG immediate release tablet Take 1 tablet (15 mg) by mouth every 6 hours as needed for pain. 120 tablet 0  ? oxyCODONE (ROXICODONE) 15 MG immediate release tablet Take 1 tablet (15 mg total) by mouth every 6 (six) hours as needed for pain 120 tablet 0  ? oxyCODONE (ROXICODONE) 15 MG immediate release tablet Take 1 tablet by mouth every 6 hours 120 tablet 0  ? Oxycodone HCl 10 MG TABS Take 1 tablet every 6 hours as needed 120 tablet 0  ? predniSONE (DELTASONE) 5 MG tablet Take 1 tablet by mouth daily. 5 tablet 0  ? predniSONE (STERAPRED UNI-PAK 21 TAB) 5 MG (21) TBPK tablet Take as directed on package 21 tablet 2  ? promethazine-codeine (PHENERGAN WITH CODEINE) 6.25-10 MG/5ML syrup Take 5 mLs by mouth every 4 (four) hours as needed for cough. (Patient not taking: Reported on 02/26/2020) 240 mL 0  ? promethazine-dextromethorphan (PROMETHAZINE-DM) 6.25-15 MG/5ML syrup Take 5 mLs by mouth every 4 to 6 hours as needed for cough. 120 mL 0  ? ?No current facility-administered medications on file prior to visit.  ? ? ?No Known Allergies ? ?Review of Systems ?No fevers, chills, nausea, muscle aches, no difficulty breathing, no calf pain, no chest pain or shortness of breath. ? ? ?Physical Exam ? ?GENERAL APPEARANCE: Alert, conversant. Appropriately groomed. No acute distress.  ? ?VASCULAR: Pedal pulses palpable 2/4 DP and PT bilateral.  Capillary refill  time is immediate to all digits,  Proximal to distal cooling it warm to warm.  Digital perfusion adequate.  ? ?NEUROLOGIC: sensation is intact to 5.07 monofilament at 5/5 sites bilateral.  Light touch is intact bilateral, vibratory sensation intact bilateral.  Subjective feeling of cold feet is reported.  ? ?MUSCULOSKELETAL: acceptable muscle strength, tone and stability bilateral.  No gross boney pedal deformities noted.  No pain, crepitus or limitation noted with foot and ankle range of motion bilateral.  ? ?  DERMATOLOGIC: skin is warm, supple, and dry.  Color, texture, and turgor of skin within normal limits.  No open wounds are noted.  No preulcerative lesions are seen.  Digital nails are asymptomatic.   ? ? ? ?Assessment  ? ?  ICD-10-CM   ?1. Encounter for diabetic foot exam ([place])  E11.9   ?  ? ? ? ?Plan ?Discussed exam findings with the patient. Overall her foot health is very good.  We discussed general foot care principles for those living with diabetes and the importance of good glycemic control for overall health and foot health.  I did suggest a nutrition consultation due to her newly being diagnosed.  She will contact her primary care physician for this consultation and if I can be of any assistance in the future she will call.  Otherwise she will return as needed for follow up.  ?

## 2021-06-01 ENCOUNTER — Other Ambulatory Visit (HOSPITAL_COMMUNITY): Payer: Self-pay

## 2021-06-01 MED ORDER — OXYCODONE HCL 15 MG PO TABS
ORAL_TABLET | ORAL | 0 refills | Status: DC
  Filled 2021-06-01 – 2021-06-02 (×2): qty 120, 30d supply, fill #0

## 2021-06-02 ENCOUNTER — Other Ambulatory Visit (HOSPITAL_COMMUNITY): Payer: Self-pay

## 2021-06-09 ENCOUNTER — Other Ambulatory Visit (HOSPITAL_COMMUNITY): Payer: Self-pay

## 2021-06-29 ENCOUNTER — Other Ambulatory Visit (HOSPITAL_COMMUNITY): Payer: Self-pay

## 2021-06-29 MED ORDER — JARDIANCE 25 MG PO TABS
ORAL_TABLET | ORAL | 3 refills | Status: DC
  Filled 2021-06-29: qty 30, 30d supply, fill #0

## 2021-06-29 MED ORDER — OXYCODONE HCL 15 MG PO TABS
ORAL_TABLET | ORAL | 0 refills | Status: DC
  Filled 2021-06-29: qty 120, 30d supply, fill #0

## 2021-07-24 ENCOUNTER — Other Ambulatory Visit (HOSPITAL_COMMUNITY): Payer: Self-pay

## 2021-07-27 ENCOUNTER — Other Ambulatory Visit (HOSPITAL_COMMUNITY): Payer: Self-pay

## 2021-07-27 MED ORDER — OZEMPIC (0.25 OR 0.5 MG/DOSE) 2 MG/3ML ~~LOC~~ SOPN
PEN_INJECTOR | SUBCUTANEOUS | 0 refills | Status: DC
  Filled 2021-07-27: qty 3, 42d supply, fill #0
  Filled 2021-07-28: qty 3, 56d supply, fill #0

## 2021-07-27 MED ORDER — OXYCODONE HCL 15 MG PO TABS
ORAL_TABLET | ORAL | 0 refills | Status: DC
  Filled 2021-07-27: qty 120, 30d supply, fill #0

## 2021-07-28 ENCOUNTER — Other Ambulatory Visit (HOSPITAL_COMMUNITY): Payer: Self-pay

## 2021-08-24 ENCOUNTER — Other Ambulatory Visit (HOSPITAL_COMMUNITY): Payer: Self-pay

## 2021-08-24 MED ORDER — NALOXONE HCL 4 MG/0.1ML NA LIQD
NASAL | 0 refills | Status: AC
Start: 2021-08-24 — End: ?
  Filled 2021-08-24: qty 2, 30d supply, fill #0

## 2021-08-24 MED ORDER — OZEMPIC (0.25 OR 0.5 MG/DOSE) 2 MG/3ML ~~LOC~~ SOPN
PEN_INJECTOR | SUBCUTANEOUS | 0 refills | Status: DC
  Filled 2021-08-24: qty 3, 28d supply, fill #0

## 2021-08-24 MED ORDER — OXYCODONE HCL 15 MG PO TABS
ORAL_TABLET | ORAL | 0 refills | Status: DC
  Filled 2021-08-24: qty 120, 30d supply, fill #0

## 2021-09-16 ENCOUNTER — Other Ambulatory Visit (HOSPITAL_COMMUNITY): Payer: Self-pay

## 2021-09-21 ENCOUNTER — Other Ambulatory Visit (HOSPITAL_COMMUNITY): Payer: Self-pay

## 2021-09-21 MED ORDER — OZEMPIC (1 MG/DOSE) 4 MG/3ML ~~LOC~~ SOPN
PEN_INJECTOR | SUBCUTANEOUS | 0 refills | Status: DC
  Filled 2021-09-24 (×2): qty 3, 28d supply, fill #0

## 2021-09-21 MED ORDER — OXYCODONE HCL 15 MG PO TABS
ORAL_TABLET | ORAL | 0 refills | Status: DC
  Filled 2021-09-24: qty 120, 30d supply, fill #0

## 2021-09-21 MED ORDER — LEVOFLOXACIN 500 MG PO TABS
ORAL_TABLET | ORAL | 0 refills | Status: DC
  Filled 2021-09-21: qty 7, 7d supply, fill #0

## 2021-09-24 ENCOUNTER — Other Ambulatory Visit (HOSPITAL_COMMUNITY): Payer: Self-pay

## 2021-09-28 ENCOUNTER — Other Ambulatory Visit (HOSPITAL_COMMUNITY): Payer: Self-pay

## 2021-10-05 ENCOUNTER — Other Ambulatory Visit (HOSPITAL_COMMUNITY): Payer: Self-pay

## 2021-10-08 ENCOUNTER — Other Ambulatory Visit (HOSPITAL_COMMUNITY): Payer: Self-pay

## 2021-10-14 ENCOUNTER — Other Ambulatory Visit: Payer: Self-pay | Admitting: Specialist

## 2021-10-14 DIAGNOSIS — Z1231 Encounter for screening mammogram for malignant neoplasm of breast: Secondary | ICD-10-CM

## 2021-10-19 ENCOUNTER — Other Ambulatory Visit (HOSPITAL_COMMUNITY): Payer: Self-pay

## 2021-10-19 MED ORDER — METFORMIN HCL 850 MG PO TABS
ORAL_TABLET | ORAL | 5 refills | Status: DC
  Filled 2021-10-19: qty 60, 30d supply, fill #0

## 2021-10-19 MED ORDER — OXYCODONE HCL 15 MG PO TABS
ORAL_TABLET | ORAL | 0 refills | Status: DC
  Filled 2021-10-19 – 2021-10-23 (×2): qty 120, 30d supply, fill #0

## 2021-10-19 MED ORDER — AMLODIPINE BESYLATE 5 MG PO TABS
ORAL_TABLET | ORAL | 1 refills | Status: DC
  Filled 2021-10-19: qty 90, 90d supply, fill #0
  Filled 2022-02-01 – 2022-02-12 (×2): qty 90, 90d supply, fill #1

## 2021-10-19 MED ORDER — METOPROLOL TARTRATE 25 MG PO TABS
ORAL_TABLET | ORAL | 5 refills | Status: DC
  Filled 2021-10-19: qty 60, 30d supply, fill #0
  Filled 2022-03-28 – 2022-04-19 (×2): qty 60, 30d supply, fill #1
  Filled 2022-06-23: qty 60, 30d supply, fill #2

## 2021-10-19 MED ORDER — JARDIANCE 25 MG PO TABS: ORAL_TABLET | ORAL | 3 refills | Status: DC

## 2021-10-23 ENCOUNTER — Other Ambulatory Visit (HOSPITAL_COMMUNITY): Payer: Self-pay

## 2021-10-25 ENCOUNTER — Other Ambulatory Visit (HOSPITAL_COMMUNITY): Payer: Self-pay

## 2021-11-23 ENCOUNTER — Other Ambulatory Visit (HOSPITAL_COMMUNITY): Payer: Self-pay

## 2021-11-23 MED ORDER — METFORMIN HCL 1000 MG PO TABS
1000.0000 mg | ORAL_TABLET | Freq: Two times a day (BID) | ORAL | 0 refills | Status: DC
  Filled 2021-11-23: qty 60, 30d supply, fill #0

## 2021-11-23 MED ORDER — OXYCODONE HCL 15 MG PO TABS
15.0000 mg | ORAL_TABLET | Freq: Four times a day (QID) | ORAL | 0 refills | Status: DC
  Filled 2021-11-23: qty 120, 30d supply, fill #0

## 2021-12-20 ENCOUNTER — Ambulatory Visit: Payer: 59

## 2021-12-21 ENCOUNTER — Other Ambulatory Visit (HOSPITAL_COMMUNITY): Payer: Self-pay

## 2021-12-21 MED ORDER — OXYCODONE HCL 15 MG PO TABS
15.0000 mg | ORAL_TABLET | Freq: Four times a day (QID) | ORAL | 0 refills | Status: DC
  Filled 2021-12-22: qty 120, 30d supply, fill #0

## 2021-12-22 ENCOUNTER — Other Ambulatory Visit (HOSPITAL_COMMUNITY): Payer: Self-pay

## 2021-12-29 ENCOUNTER — Other Ambulatory Visit (HOSPITAL_COMMUNITY): Payer: Self-pay

## 2022-01-18 ENCOUNTER — Other Ambulatory Visit (HOSPITAL_COMMUNITY): Payer: Self-pay

## 2022-01-18 MED ORDER — OXYCODONE HCL 15 MG PO TABS
15.0000 mg | ORAL_TABLET | Freq: Four times a day (QID) | ORAL | 0 refills | Status: DC
  Filled 2022-01-19: qty 120, 30d supply, fill #0

## 2022-01-19 ENCOUNTER — Other Ambulatory Visit (HOSPITAL_COMMUNITY): Payer: Self-pay

## 2022-01-25 ENCOUNTER — Inpatient Hospital Stay: Admission: RE | Admit: 2022-01-25 | Payer: 59 | Source: Ambulatory Visit

## 2022-02-01 ENCOUNTER — Other Ambulatory Visit: Payer: Self-pay

## 2022-02-09 ENCOUNTER — Other Ambulatory Visit (HOSPITAL_COMMUNITY): Payer: Self-pay

## 2022-02-12 ENCOUNTER — Other Ambulatory Visit (HOSPITAL_COMMUNITY): Payer: Self-pay

## 2022-02-15 ENCOUNTER — Other Ambulatory Visit (HOSPITAL_COMMUNITY): Payer: Self-pay

## 2022-02-15 MED ORDER — AMLODIPINE BESYLATE 5 MG PO TABS
ORAL_TABLET | ORAL | 1 refills | Status: DC
  Filled 2022-02-15: qty 90, 90d supply, fill #0

## 2022-02-15 MED ORDER — FLUCONAZOLE 150 MG PO TABS
ORAL_TABLET | ORAL | 2 refills | Status: DC
  Filled 2022-02-15: qty 2, 2d supply, fill #0

## 2022-02-15 MED ORDER — OXYCODONE HCL 15 MG PO TABS
15.0000 mg | ORAL_TABLET | Freq: Four times a day (QID) | ORAL | 0 refills | Status: DC
  Filled 2022-02-18: qty 120, 30d supply, fill #0

## 2022-02-17 ENCOUNTER — Other Ambulatory Visit (HOSPITAL_COMMUNITY): Payer: Self-pay

## 2022-02-18 ENCOUNTER — Other Ambulatory Visit (HOSPITAL_COMMUNITY): Payer: Self-pay

## 2022-02-23 ENCOUNTER — Inpatient Hospital Stay: Admission: RE | Admit: 2022-02-23 | Payer: 59 | Source: Ambulatory Visit

## 2022-02-28 ENCOUNTER — Other Ambulatory Visit (HOSPITAL_COMMUNITY): Payer: Self-pay

## 2022-03-03 ENCOUNTER — Other Ambulatory Visit: Payer: Self-pay

## 2022-03-03 DIAGNOSIS — Z1231 Encounter for screening mammogram for malignant neoplasm of breast: Secondary | ICD-10-CM

## 2022-03-18 ENCOUNTER — Other Ambulatory Visit (HOSPITAL_COMMUNITY): Payer: Self-pay

## 2022-03-18 MED ORDER — OXYCODONE HCL 15 MG PO TABS
15.0000 mg | ORAL_TABLET | Freq: Four times a day (QID) | ORAL | 0 refills | Status: DC
  Filled 2022-03-18: qty 120, 30d supply, fill #0

## 2022-03-18 MED ORDER — METFORMIN HCL 850 MG PO TABS
850.0000 mg | ORAL_TABLET | Freq: Two times a day (BID) | ORAL | 3 refills | Status: DC
  Filled 2022-03-18: qty 180, 90d supply, fill #0

## 2022-03-28 ENCOUNTER — Other Ambulatory Visit: Payer: Self-pay

## 2022-04-05 ENCOUNTER — Ambulatory Visit: Payer: 59

## 2022-04-07 ENCOUNTER — Other Ambulatory Visit (HOSPITAL_COMMUNITY): Payer: Self-pay

## 2022-04-19 ENCOUNTER — Other Ambulatory Visit: Payer: Self-pay

## 2022-04-19 ENCOUNTER — Other Ambulatory Visit (HOSPITAL_COMMUNITY): Payer: Self-pay

## 2022-04-19 MED ORDER — OXYCODONE HCL 15 MG PO TABS
15.0000 mg | ORAL_TABLET | Freq: Four times a day (QID) | ORAL | 0 refills | Status: DC
  Filled 2022-04-19: qty 120, 30d supply, fill #0

## 2022-05-03 ENCOUNTER — Ambulatory Visit: Payer: Self-pay | Admitting: *Deleted

## 2022-05-03 ENCOUNTER — Ambulatory Visit
Admission: RE | Admit: 2022-05-03 | Discharge: 2022-05-03 | Disposition: A | Discharge status: Home / Self Care | Payer: No Typology Code available for payment source | Source: Ambulatory Visit | Attending: Obstetrics and Gynecology | Admitting: Obstetrics and Gynecology

## 2022-05-03 VITALS — BP 122/88 | Wt 252.5 lb

## 2022-05-03 DIAGNOSIS — Z1239 Encounter for other screening for malignant neoplasm of breast: Secondary | ICD-10-CM

## 2022-05-03 DIAGNOSIS — Z1231 Encounter for screening mammogram for malignant neoplasm of breast: Secondary | ICD-10-CM

## 2022-05-03 DIAGNOSIS — Z1211 Encounter for screening for malignant neoplasm of colon: Secondary | ICD-10-CM

## 2022-05-03 NOTE — Progress Notes (Signed)
Ms. Jone Cannistra is a 52 y.o. female who presents to [facility] clinic today with no complaints.    Pap Smear: Pap smear not completed today. Last Pap smear was over 3 years ago at [facility] clinic and was normal per patient. Per patient has no history of an abnormal Pap smear. Last Pap smear result is not available in Epic.  MM 3D SCREEN BREAST BILATERAL  Result Date: 12/21/2020 CLINICAL DATA:  Screening. EXAM: DIGITAL SCREENING BILATERAL MAMMOGRAM WITH TOMOSYNTHESIS AND CAD TECHNIQUE: Bilateral screening digital craniocaudal and mediolateral oblique mammograms were obtained. Bilateral screening digital breast tomosynthesis was performed. The images were evaluated with computer-aided detection. COMPARISON:  None ACR Breast Density Category a: The breast tissue is almost entirely fatty. FINDINGS: There are no findings suspicious for malignancy. IMPRESSION: No mammographic evidence of malignancy. A result letter of this screening mammogram will be mailed directly to the patient. RECOMMENDATION: Screening mammogram in one year. (Code:SM-B-01Y) BI-RADS CATEGORY  1: Negative. Electronically Signed   By: Nolon Nations M.D.   On: 12/21/2020 13:41     Physical exam: Breasts Breasts symmetrical. No skin abnormalities bilateral breasts. No nipple retraction bilateral breasts. No nipple discharge bilateral breasts. No lymphadenopathy. No lumps palpated bilateral breasts. No complaints of pain or tenderness on exam.      Pelvic/Bimanual Pap smear due today. Patient refused Pap smear today. Patient scheduled appointment to come to the free cervical cancer screening on Wednesday, June 08, 2022 at 1500.   Smoking History: Patient has never smoked.   Patient Navigation: Patient education provided. Access to services provided for patient through [facility] program.   Colorectal Cancer Screening: Per patient has never had colonoscopy completed. FIT Test given to patient to complete. No complaints today.     Breast and Cervical Cancer Risk Assessment: Patient does not have family history of breast cancer, known genetic mutations, or radiation treatment to the chest before age 48. Patient has history of cervical dysplasia, immunocompromised, or DES exposure in-utero.  Risk Scores as of 05/03/2022     [place]           5-year 1.02 %   Lifetime 6.21 %            Last calculated by Royston Bake, CMA on 05/03/2022 at  9:22 AM        A: [facility] exam without pap smear No complaints.  P: Referred patient to the [facility] for a screening mammogram on mobile unit. Appointment scheduled Tuesday, May 03, 2022 at 1010.  Loletta Parish, RN 05/03/2022 9:37 AM

## 2022-05-03 NOTE — Patient Instructions (Addendum)
Explained breast self awareness with Andrea [name]. Pap smear due today. Patient refused Pap smear today. Patient scheduled appointment to come to the free cervical cancer screening on Wednesday, June 08, 2022 at 1500. Let her know [facility] will cover Pap smears every 3 years unless has a history of abnormal Pap smears. Referred patient to the [facility] for a screening mammogram on mobile unit. Appointment scheduled Tuesday, May 03, 2022 at 1010. Patient aware of appointment and will be there. Let [facility] know [facility] will follow up with her within the next couple weeks with results of her mammogram by letter or phone. Trecia Rogers Heavin verbalized understanding.  [name], [name], RN 9:37 AM

## 2022-05-20 ENCOUNTER — Other Ambulatory Visit (HOSPITAL_COMMUNITY): Payer: Self-pay

## 2022-05-20 MED ORDER — PREDNISONE 5 MG PO TABS
5.0000 mg | ORAL_TABLET | Freq: Every day | ORAL | 3 refills | Status: DC
  Filled 2022-05-20: qty 30, 30d supply, fill #0

## 2022-05-20 MED ORDER — OXYCODONE HCL 15 MG PO TABS
15.0000 mg | ORAL_TABLET | Freq: Four times a day (QID) | ORAL | 0 refills | Status: DC
  Filled 2022-05-20: qty 120, 30d supply, fill #0

## 2022-05-20 MED ORDER — PROMETHAZINE-DM 6.25-15 MG/5ML PO SYRP
ORAL_SOLUTION | ORAL | 0 refills | Status: DC
  Filled 2022-05-20: qty 473, 23d supply, fill #0

## 2022-06-08 ENCOUNTER — Other Ambulatory Visit: Payer: Self-pay | Admitting: Hematology and Oncology

## 2022-06-08 DIAGNOSIS — Z124 Encounter for screening for malignant neoplasm of cervix: Secondary | ICD-10-CM

## 2022-06-08 NOTE — Progress Notes (Signed)
Patient: Andrea [name]           Date of Birth: 1970/07/01           MRN: [id number] Visit Date: 06/08/2022 PCP: Jearld Lesch, MD  Cervical Cancer Screening Do you smoke?: No Have you ever had or been told you have an allergy to latex products?: No Marital status: Single Date of last pap smear: 2-5 yrs ago Date of last menstrual period:  (Irregular) Number of pregnancies: 4 Number of births: 3 Have you ever had any of the following? Hysterectomy: No Tubal ligation (tubes tied): No Abnormal bleeding: No Abnormal pap smear: No Venereal warts: No A sex partner with venereal warts: No A high risk* sex partner: No  Cervical Exam Pap smear completed: Pap test Abnormal Observations: Normal exam.  Recommendations: Will repeat in 5 years if normal and negative HPV.      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   Drug use: No    Sexual activity: Yes    Birth control/protection: Post-menopausal

## 2022-06-10 LAB — CYTOLOGY - PAP
Adequacy: ABSENT
Comment: NEGATIVE
Comment: NEGATIVE
Comment: NEGATIVE
Diagnosis: NEGATIVE
HPV 16: NEGATIVE
HPV 18 / 45: NEGATIVE
High risk HPV: POSITIVE — AB

## 2022-06-17 ENCOUNTER — Other Ambulatory Visit (HOSPITAL_COMMUNITY): Payer: Self-pay

## 2022-06-17 MED ORDER — AMLODIPINE BESYLATE 5 MG PO TABS
5.0000 mg | ORAL_TABLET | Freq: Every day | ORAL | 1 refills | Status: DC
  Filled 2022-06-17: qty 90, 90d supply, fill #0

## 2022-06-17 MED ORDER — OXYCODONE HCL 15 MG PO TABS
15.0000 mg | ORAL_TABLET | Freq: Four times a day (QID) | ORAL | 0 refills | Status: DC
  Filled 2022-06-17: qty 120, 30d supply, fill #0

## 2022-06-23 ENCOUNTER — Other Ambulatory Visit: Payer: Self-pay

## 2022-07-12 ENCOUNTER — Other Ambulatory Visit (HOSPITAL_COMMUNITY): Payer: Self-pay

## 2022-07-12 MED ORDER — METOPROLOL TARTRATE 25 MG PO TABS
ORAL_TABLET | ORAL | 5 refills | Status: DC
  Filled 2022-07-12: qty 60, 30d supply, fill #0

## 2022-07-12 MED ORDER — METFORMIN HCL 850 MG PO TABS
ORAL_TABLET | ORAL | 3 refills | Status: DC
  Filled 2022-07-12: qty 180, 90d supply, fill #0

## 2022-07-12 MED ORDER — OXYCODONE HCL 15 MG PO TABS
15.0000 mg | ORAL_TABLET | Freq: Four times a day (QID) | ORAL | 0 refills | Status: DC
  Filled 2022-07-15: qty 120, 30d supply, fill #0

## 2022-07-15 ENCOUNTER — Other Ambulatory Visit: Payer: Self-pay

## 2022-07-15 ENCOUNTER — Other Ambulatory Visit (HOSPITAL_COMMUNITY): Payer: Self-pay

## 2022-07-19 ENCOUNTER — Ambulatory Visit: Payer: Self-pay | Admitting: Podiatry

## 2022-08-09 ENCOUNTER — Other Ambulatory Visit (HOSPITAL_COMMUNITY): Payer: Self-pay

## 2022-08-09 MED ORDER — METFORMIN HCL 1000 MG PO TABS
500.0000 mg | ORAL_TABLET | Freq: Two times a day (BID) | ORAL | 0 refills | Status: DC
  Filled 2022-08-09: qty 30, 30d supply, fill #0
  Filled 2022-08-10: qty 60, 30d supply, fill #0

## 2022-08-09 MED ORDER — METOPROLOL TARTRATE 50 MG PO TABS
100.0000 mg | ORAL_TABLET | Freq: Two times a day (BID) | ORAL | 5 refills | Status: DC
  Filled 2022-08-09: qty 60, 15d supply, fill #0

## 2022-08-09 MED ORDER — AMLODIPINE BESYLATE 10 MG PO TABS
10.0000 mg | ORAL_TABLET | Freq: Every day | ORAL | 5 refills | Status: DC
  Filled 2022-08-09: qty 30, 30d supply, fill #0

## 2022-08-09 MED ORDER — OXYCODONE HCL 15 MG PO TABS
15.0000 mg | ORAL_TABLET | Freq: Four times a day (QID) | ORAL | 0 refills | Status: DC
  Filled 2022-08-12: qty 120, 30d supply, fill #0

## 2022-08-10 ENCOUNTER — Other Ambulatory Visit (HOSPITAL_COMMUNITY): Payer: Self-pay

## 2022-08-12 ENCOUNTER — Other Ambulatory Visit: Payer: Self-pay

## 2022-08-12 ENCOUNTER — Other Ambulatory Visit (HOSPITAL_COMMUNITY): Payer: Self-pay

## 2022-08-17 ENCOUNTER — Other Ambulatory Visit (HOSPITAL_COMMUNITY): Payer: Self-pay

## 2022-08-19 ENCOUNTER — Other Ambulatory Visit (HOSPITAL_COMMUNITY): Payer: Self-pay

## 2022-08-26 ENCOUNTER — Encounter (HOSPITAL_BASED_OUTPATIENT_CLINIC_OR_DEPARTMENT_OTHER): Payer: Self-pay | Admitting: Emergency Medicine

## 2022-08-26 ENCOUNTER — Emergency Department (HOSPITAL_BASED_OUTPATIENT_CLINIC_OR_DEPARTMENT_OTHER): Payer: Self-pay | Admitting: Radiology

## 2022-08-26 ENCOUNTER — Inpatient Hospital Stay (HOSPITAL_BASED_OUTPATIENT_CLINIC_OR_DEPARTMENT_OTHER)
Admission: EM | Admit: 2022-08-26 | Discharge: 2022-08-29 | DRG: 871 | Disposition: A | Discharge status: Home / Self Care | Payer: 59 | Attending: Internal Medicine | Admitting: Internal Medicine

## 2022-08-26 ENCOUNTER — Other Ambulatory Visit: Payer: Self-pay

## 2022-08-26 ENCOUNTER — Other Ambulatory Visit (HOSPITAL_COMMUNITY): Payer: Self-pay

## 2022-08-26 ENCOUNTER — Emergency Department (HOSPITAL_BASED_OUTPATIENT_CLINIC_OR_DEPARTMENT_OTHER): Payer: Self-pay

## 2022-08-26 DIAGNOSIS — J9621 Acute and chronic respiratory failure with hypoxia: Secondary | ICD-10-CM

## 2022-08-26 DIAGNOSIS — E1065 Type 1 diabetes mellitus with hyperglycemia: Secondary | ICD-10-CM | POA: Diagnosis present

## 2022-08-26 DIAGNOSIS — A419 Sepsis, unspecified organism: Principal | ICD-10-CM | POA: Diagnosis present

## 2022-08-26 DIAGNOSIS — Z794 Long term (current) use of insulin: Secondary | ICD-10-CM

## 2022-08-26 DIAGNOSIS — Z79899 Other long term (current) drug therapy: Secondary | ICD-10-CM

## 2022-08-26 DIAGNOSIS — E1042 Type 1 diabetes mellitus with diabetic polyneuropathy: Secondary | ICD-10-CM | POA: Diagnosis present

## 2022-08-26 DIAGNOSIS — E876 Hypokalemia: Secondary | ICD-10-CM | POA: Diagnosis not present

## 2022-08-26 DIAGNOSIS — E1022 Type 1 diabetes mellitus with diabetic chronic kidney disease: Secondary | ICD-10-CM | POA: Diagnosis present

## 2022-08-26 DIAGNOSIS — Z7984 Long term (current) use of oral hypoglycemic drugs: Secondary | ICD-10-CM

## 2022-08-26 DIAGNOSIS — Z8249 Family history of ischemic heart disease and other diseases of the circulatory system: Secondary | ICD-10-CM

## 2022-08-26 DIAGNOSIS — Z8269 Family history of other diseases of the musculoskeletal system and connective tissue: Secondary | ICD-10-CM

## 2022-08-26 DIAGNOSIS — Z1152 Encounter for screening for COVID-19: Secondary | ICD-10-CM

## 2022-08-26 DIAGNOSIS — N182 Chronic kidney disease, stage 2 (mild): Secondary | ICD-10-CM | POA: Diagnosis present

## 2022-08-26 DIAGNOSIS — M329 Systemic lupus erythematosus, unspecified: Secondary | ICD-10-CM | POA: Diagnosis present

## 2022-08-26 DIAGNOSIS — R6510 Systemic inflammatory response syndrome (SIRS) of non-infectious origin without acute organ dysfunction: Secondary | ICD-10-CM | POA: Diagnosis present

## 2022-08-26 DIAGNOSIS — I129 Hypertensive chronic kidney disease with stage 1 through stage 4 chronic kidney disease, or unspecified chronic kidney disease: Secondary | ICD-10-CM | POA: Diagnosis present

## 2022-08-26 DIAGNOSIS — R051 Acute cough: Principal | ICD-10-CM

## 2022-08-26 DIAGNOSIS — G4739 Other sleep apnea: Secondary | ICD-10-CM | POA: Insufficient documentation

## 2022-08-26 DIAGNOSIS — R739 Hyperglycemia, unspecified: Secondary | ICD-10-CM

## 2022-08-26 DIAGNOSIS — Z7985 Long-term (current) use of injectable non-insulin antidiabetic drugs: Secondary | ICD-10-CM

## 2022-08-26 DIAGNOSIS — J849 Interstitial pulmonary disease, unspecified: Secondary | ICD-10-CM | POA: Diagnosis present

## 2022-08-26 DIAGNOSIS — Z6841 Body Mass Index (BMI) 40.0 and over, adult: Secondary | ICD-10-CM

## 2022-08-26 DIAGNOSIS — J9601 Acute respiratory failure with hypoxia: Secondary | ICD-10-CM | POA: Diagnosis present

## 2022-08-26 DIAGNOSIS — E871 Hypo-osmolality and hyponatremia: Secondary | ICD-10-CM | POA: Diagnosis present

## 2022-08-26 DIAGNOSIS — J189 Pneumonia, unspecified organism: Secondary | ICD-10-CM | POA: Diagnosis present

## 2022-08-26 LAB — CBG MONITORING, ED: Glucose-Capillary: 365 mg/dL — ABNORMAL HIGH (ref 70–99)

## 2022-08-26 LAB — URINALYSIS, ROUTINE W REFLEX MICROSCOPIC
Bilirubin Urine: NEGATIVE
Glucose, UA: >1000 mg/dL — AB
Ketones, ur: NEGATIVE mg/dL
Leukocytes,Ua: NEGATIVE
Nitrite: NEGATIVE
Protein, ur: >300 mg/dL — AB
Specific Gravity, Urine: 1.037 — ABNORMAL HIGH (ref 1.005–1.030)
pH: 6.5 (ref 5.0–8.0)

## 2022-08-26 LAB — I-STAT VENOUS BLOOD GAS, ED
Acid-base deficit: 1 mmol/L (ref 0.0–2.0)
Bicarbonate: 22.2 mmol/L (ref 20.0–28.0)
Calcium, Ion: 1.21 mmol/L (ref 1.15–1.40)
HCT: 38 % (ref 36.0–46.0)
Hemoglobin: 12.9 g/dL (ref 12.0–15.0)
O2 Saturation: 92 %
Patient temperature: 99.1
Potassium: 3.7 mmol/L (ref 3.5–5.1)
Sodium: 132 mmol/L — ABNORMAL LOW (ref 135–145)
TCO2: 23 mmol/L (ref 22–32)
pCO2, Ven: 32.1 mmHg — ABNORMAL LOW (ref 44–60)
pH, Ven: 7.448 — ABNORMAL HIGH (ref 7.25–7.43)
pO2, Ven: 60 mmHg — ABNORMAL HIGH (ref 32–45)

## 2022-08-26 LAB — CBC WITH DIFFERENTIAL/PLATELET
Abs Immature Granulocytes: 0.03 10*3/uL (ref 0.00–0.07)
Basophils Absolute: 0 10*3/uL (ref 0.0–0.1)
Basophils Relative: 0 %
Eosinophils Absolute: 0 10*3/uL (ref 0.0–0.5)
Eosinophils Relative: 0 %
HCT: 37 % (ref 36.0–46.0)
Hemoglobin: 12 g/dL (ref 12.0–15.0)
Immature Granulocytes: 0 %
Lymphocytes Relative: 35 %
Lymphs Abs: 3.5 10*3/uL (ref 0.7–4.0)
MCH: 26.8 pg (ref 26.0–34.0)
MCHC: 32.4 g/dL (ref 30.0–36.0)
MCV: 82.8 fL (ref 80.0–100.0)
Monocytes Absolute: 1.2 10*3/uL — ABNORMAL HIGH (ref 0.1–1.0)
Monocytes Relative: 12 %
Neutro Abs: 5.2 10*3/uL (ref 1.7–7.7)
Neutrophils Relative %: 53 %
Platelets: 186 10*3/uL (ref 150–400)
RBC: 4.47 MIL/uL (ref 3.87–5.11)
RDW: 14.3 % (ref 11.5–15.5)
WBC: 9.9 10*3/uL (ref 4.0–10.5)
nRBC: 0 % (ref 0.0–0.2)

## 2022-08-26 LAB — RESP PANEL BY RT-PCR (RSV, FLU A&B, COVID)  RVPGX2
Influenza A by PCR: NEGATIVE
Influenza B by PCR: NEGATIVE
Resp Syncytial Virus by PCR: NEGATIVE
SARS Coronavirus 2 by RT PCR: NEGATIVE

## 2022-08-26 LAB — BASIC METABOLIC PANEL
Anion gap: 12 (ref 5–15)
BUN: 12 mg/dL (ref 6–20)
CO2: 21 mmol/L — ABNORMAL LOW (ref 22–32)
Calcium: 9 mg/dL (ref 8.9–10.3)
Chloride: 98 mmol/L (ref 98–111)
Creatinine, Ser: 0.84 mg/dL (ref 0.44–1.00)
GFR, Estimated: >60 mL/min (ref >60)
Glucose, Bld: 391 mg/dL — ABNORMAL HIGH (ref 70–99)
Potassium: 3.8 mmol/L (ref 3.5–5.1)
Sodium: 131 mmol/L — ABNORMAL LOW (ref 135–145)

## 2022-08-26 LAB — BRAIN NATRIURETIC PEPTIDE: B Natriuretic Peptide: 16.3 pg/mL (ref 0.0–100.0)

## 2022-08-26 LAB — GROUP A STREP BY PCR: Group A Strep by PCR: NOT DETECTED

## 2022-08-26 LAB — TROPONIN I (HIGH SENSITIVITY): Troponin I (High Sensitivity): 11 ng/L (ref <18)

## 2022-08-26 MED ORDER — IOHEXOL 350 MG/ML SOLN
100.0000 mL | Freq: Once | INTRAVENOUS | Status: AC | PRN
  Administered 2022-08-27: 100 mL via INTRAVENOUS

## 2022-08-26 MED ORDER — SODIUM CHLORIDE 0.9 % IV BOLUS
1000.0000 mL | Freq: Once | INTRAVENOUS | Status: AC
  Administered 2022-08-26: 1000 mL via INTRAVENOUS

## 2022-08-26 MED ORDER — ACETAMINOPHEN 325 MG PO TABS
650.0000 mg | ORAL_TABLET | Freq: Once | ORAL | Status: AC
  Administered 2022-08-26: 650 mg via ORAL
  Filled 2022-08-26: qty 2

## 2022-08-26 MED ORDER — IBUPROFEN 800 MG PO TABS
800.0000 mg | ORAL_TABLET | Freq: Once | ORAL | Status: AC
  Administered 2022-08-27: 800 mg via ORAL
  Filled 2022-08-26: qty 1

## 2022-08-26 NOTE — ED Triage Notes (Signed)
Patient c/o cough x week with fever.  Patient hoarse in triage.

## 2022-08-26 NOTE — ED Notes (Signed)
RT placed PIV in right [facility] w/out difficulty. PIV flushed, secured, saline locked.

## 2022-08-26 NOTE — ED Provider Notes (Signed)
[place] EMERGENCY DEPARTMENT AT [facility] Provider Note   CSN: [id number] Arrival date & time: 08/26/22  2144    History  Chief Complaint  Patient presents with   Cough   Fever    Andrea [name] is a 52 y.o. female history of diabetes, interstitial lung disease, hypertension, lupus here for evaluation of feeling unwell.  Cough 1 week ago.  Fever 2 days ago.  Nonproductive.  Has shortness of breath and some pleuritic left-sided pain.  Pain or swelling to lower extremities.  Has had fevers.  No meds PTA.  Given Tylenol in triage.  Sore throat and hoarse voice over the last 2 to 3 days.  Able to eat and drink.  Blood sugars have been running higher than normal into the 300s.  No history of DKA.  No back pain, lower extremity pain, PND, orthopnea, abdominal pain, nausea or vomiting.  She does admit to polyuria however no dysuria or hematuria    HPI     Home Medications Prior to Admission medications   Medication Sig Start Date End Date Taking? Authorizing Provider  albuterol (VENTOLIN HFA) 108 (90 Base) MCG/ACT inhaler Inhale 1 - 2 puffs by mouth every 4 hours as needed Patient not taking: Reported on 05/03/2022 01/06/21     ALPRAZolam (XANAX) 0.25 MG tablet Take 0.25 mg by mouth 3 (three) times daily as needed. anxiety Patient not taking: Reported on 02/26/2020    [provider]  amLODipine (NORVASC) 10 MG tablet Take 1 tablet (10 mg total) by mouth daily. 08/09/22     amLODipine (NORVASC) 5 MG tablet Take 5 mg by mouth daily. 01/03/20   [provider]  amLODipine (NORVASC) 5 MG tablet TAKE 1 TABLET BY MOUTH ONCE A DAY 03/26/20 04/19/21  Treasa School, PA-C  amLODipine (NORVASC) 5 MG tablet Take 1 tablet (5 mg) by mouth once daily. 01/06/21     amLODipine (NORVASC) 5 MG tablet Take 1 tablet (5 mg) by mouth daily 02/15/22     amLODipine (NORVASC) 5 MG tablet Take 1 tablet (5 mg total) by mouth daily. 06/17/22     azithromycin (ZITHROMAX Z-PAK) 250 MG  tablet Take 2 tablets by mouth on day 1, than take 1 tablet once daily on days 2-5. Patient not taking: Reported on 05/03/2022 03/07/21     benzonatate (TESSALON) 100 MG capsule Take 1 capsule (100 mg total) by mouth 3 (three) times daily as needed for cough. 07/12/12   Parrett, Virgel Bouquet, NP  benzonatate (TESSALON) 200 MG capsule take 1 capsule (200 mg) by oral route 3 times per day as needed for cough 05/22/20     benzonatate (TESSALON) 200 MG capsule Take 1 capsule (200 mg total) by mouth 3 (three) times daily as needed for cough 08/14/20     benzonatate (TESSALON) 200 MG capsule Take 1 capsule (200 mg total) by mouth 3 (three) times daily as needed for cough 01/06/21     Blood Glucose Monitoring Suppl (ACCU-CHEK AVIVA PLUS) w/Device KIT Use as directed 3 times a day 04/06/21     celecoxib (CELEBREX) 200 MG capsule Take 1 capsule (200 mg total) by mouth daily. Patient not taking: Reported on 05/03/2022 03/08/21     cyclobenzaprine (FLEXERIL) 10 MG tablet Take 1 tablet (10 mg total) by mouth 2 (two) times daily as needed for muscle spasms. Patient not taking: Reported on 02/26/2020 10/25/19   Shanon Ace, PA-C  empagliflozin (JARDIANCE) 25 MG TABS tablet Take 1 tablet by mouth  daily Patient not taking: Reported on 05/03/2022 06/29/21     empagliflozin (JARDIANCE) 25 MG TABS tablet Take 1 tablet (25 mg) by mouth daily Patient not taking: Reported on 05/03/2022 10/19/21     fluconazole (DIFLUCAN) 150 MG tablet Take 1 tablet by mouth once. May repeat in 5 days if needed. Patient not taking: Reported on 05/03/2022 09/11/20     fluconazole (DIFLUCAN) 150 MG tablet Take 1 tablet (150 mg total) by mouth for 1 dose.  May repeat in 5 days if needed 01/06/21     fluconazole (DIFLUCAN) 150 MG tablet Take 1 tablet (150 mg) by mouth one time Patient not taking: Reported on 05/03/2022 02/15/22     gabapentin (NEURONTIN) 300 MG capsule 300mg  once daily x 3 days, then 300mg  twice daily x 3 days, then 300mg  three times  daily to continue Patient not taking: Reported on 02/26/2020 01/18/12   Kalman Shan, MD  glucose blood (ACCU-CHEK AVIVA PLUS) test strip 3 times a day 04/06/21     hydroxychloroquine (PLAQUENIL) 200 MG tablet Take by mouth daily. Patient not taking: Reported on 02/26/2020    [provider]  Lancets Misc. (ACCU-CHEK FASTCLIX LANCET) KIT Use as directed 3 times a day 04/06/21     levofloxacin (LEVAQUIN) 500 MG tablet Take 1 tablet (500 mg) by mouth daily Patient not taking: Reported on 05/03/2022 09/21/21     metFORMIN (FORTAMET) 1000 MG (OSM) 24 hr tablet Take 1 tablet (1,000 mg total) by mouth daily with breakfast. Patient not taking: Reported on 02/26/2020 12/20/11   Cristal Ford, MD  metFORMIN (GLUCOPHAGE) 1000 MG tablet Take 1 tablet (1,000 mg total) by mouth 2 (two) times daily with meals. Patient not taking: Reported on 05/03/2022 11/23/21     metFORMIN (GLUCOPHAGE) 1000 MG tablet Take 1 tablet (1,000 mg) by mouth twice a day with meals. 08/09/22     metFORMIN (GLUCOPHAGE) 500 MG tablet Take 1 tablet by mouth 2 times a day with meals Patient not taking: Reported on 05/03/2022 04/06/21     metFORMIN (GLUCOPHAGE) 850 MG tablet Take 1 tablet by mouth 2 times daily with a meal 05/04/21     metFORMIN (GLUCOPHAGE) 850 MG tablet Take 1 tablet (850 mg) by mouth 2 times per day with meals 10/19/21     metFORMIN (GLUCOPHAGE) 850 MG tablet Take 1 tablet (850 mg total) by mouth 2 (two) times daily with a meal. 03/18/22     metFORMIN (GLUCOPHAGE) 850 MG tablet Take 1 tablet by mouth 2 times per day with meals 07/12/22     metoprolol (LOPRESSOR) 50 MG tablet Take 50 mg by mouth 2 (two) times daily.    [provider]  metoprolol tartrate (LOPRESSOR) 25 MG tablet TAKE 1 TABLET BY MOUTH 2 TIMES DAILY 03/26/20 03/26/21  Treasa School, PA-C  metoprolol tartrate (LOPRESSOR) 25 MG tablet Take 1 tablet (25 mg) by mouth 2 times daily. 12/09/20     metoprolol tartrate (LOPRESSOR) 25 MG tablet Take 1 tablet  (25 mg) by oral route 2 times per day 01/06/21     metoprolol tartrate (LOPRESSOR) 25 MG tablet Take 1 tablet (25 mg) by mouth 2 times per day with food 10/19/21     metoprolol tartrate (LOPRESSOR) 25 MG tablet Take 1 tablet (25 mg) by mouth with food 2 times per day 07/12/22     metoprolol tartrate (LOPRESSOR) 50 MG tablet Take 2 tablets (100 mg total) by mouth 2 (two) times daily with a meal. 08/09/22  naloxone (NARCAN) nasal spray 4 mg/0.1 mL Inhale 1 spray (4 mg) into nostril one time as directed 08/24/21     oxyCODONE (OXY IR/ROXICODONE) 5 MG immediate release tablet Take 1 tablet by mouth every six hours as needed 02/04/21     oxyCODONE (ROXICODONE) 15 MG immediate release tablet Take 1 tablet (15 mg) by mouth every 6 hours as needed for pain 07/16/20     oxyCODONE (ROXICODONE) 15 MG immediate release tablet Take 1 tablet (15 mg total) by mouth every 6 (six) hours as needed for pain 08/14/20     oxyCODONE (ROXICODONE) 15 MG immediate release tablet Take 1 tablet (15 mg) by mouth every 6 hours as needed for pain. 12/09/20     oxyCODONE (ROXICODONE) 15 MG immediate release tablet Take 1 tablet (15 mg total) by mouth every 6 (six) hours as needed for pain 01/06/21     oxyCODONE (ROXICODONE) 15 MG immediate release tablet Take 1 tablet (15 mg) by mouth every 6 hours 08/24/21     oxyCODONE (ROXICODONE) 15 MG immediate release tablet Take 1 tablet (15 mg total) by mouth every 6 (six) hours. 11/23/21     oxyCODONE (ROXICODONE) 15 MG immediate release tablet Take 1 tablet (15 mg total) by mouth every 6 (six) hours. 01/18/22     oxyCODONE (ROXICODONE) 15 MG immediate release tablet Take 1 tablet (15 mg total) by mouth every 6 (six) hours. 06/17/22     oxyCODONE (ROXICODONE) 15 MG immediate release tablet Take 1 tablet (15 mg total) by mouth every 6 hours. 08/09/22     Oxycodone HCl 10 MG TABS Take 1 tablet every 6 hours as needed 03/08/21     predniSONE (DELTASONE) 5 MG tablet Take 1 tablet by mouth daily. 03/07/21      predniSONE (DELTASONE) 5 MG tablet Take 1 tablet (5 mg total) by mouth daily. 05/20/22     predniSONE (STERAPRED UNI-PAK 21 TAB) 5 MG (21) TBPK tablet Take as directed on package 01/06/21     promethazine-codeine (PHENERGAN WITH CODEINE) 6.25-10 MG/5ML syrup Take 5 mLs by mouth every 4 (four) hours as needed for cough. Patient not taking: Reported on 02/26/2020 01/26/12   Parrett, Virgel Bouquet, NP  promethazine-dextromethorphan (PROMETHAZINE-DM) 6.25-15 MG/5ML syrup Take 5 mLs by mouth every 4 to 6 hours as needed for cough. 03/07/21     promethazine-dextromethorphan (PROMETHAZINE-DM) 6.25-15 MG/5ML syrup Take 5 ml by mouth every 6 hours as needed 05/20/22     Semaglutide, 1 MG/DOSE, (OZEMPIC, 1 MG/DOSE,) 4 MG/3ML SOPN Inject 1 mg into the skin weekly. 09/21/21     Semaglutide,0.25 or 0.5MG /DOS, (OZEMPIC, 0.25 OR 0.5 MG/DOSE,) 2 MG/3ML SOPN Inject 0.5 mg under the skin weekly. 08/24/21         Allergies    Patient has no known allergies.    Review of Systems   Review of Systems  Constitutional:  Positive for chills, fatigue and fever.  HENT: Negative.    Respiratory:  Positive for cough and shortness of breath.   Cardiovascular:  Positive for chest pain.  Gastrointestinal: Negative.   Endocrine: Positive for polyuria.  Genitourinary: Negative.   Musculoskeletal: Negative.   Skin: Negative.   Neurological: Negative.   All other systems reviewed and are negative.   Physical Exam Updated Vital Signs BP (!) 120/96   Pulse (!) 116   Temp (!) 100.5 F (38.1 C) (Oral)   Resp (!) 26   Ht 5\' 6"  (1.676 m)   Wt 113.4 kg   LMP  09/29/2010   SpO2 96%   BMI 40.35 kg/m  Physical Exam Vitals and nursing note reviewed.  Constitutional:      General: She is not in acute distress.    Appearance: She is well-developed. She is obese. She is not ill-appearing, toxic-appearing or diaphoretic.  HENT:     Head: Normocephalic and atraumatic.     Nose: Nose normal.     Mouth/Throat:     Mouth: Mucous  membranes are moist.  Eyes:     Pupils: Pupils are equal, round, and reactive to light.  Cardiovascular:     Rate and Rhythm: Tachycardia present.     Heart sounds: Normal heart sounds.  Pulmonary:     Effort: Pulmonary effort is normal. No respiratory distress.     Breath sounds: Normal breath sounds.     Comments: Course lung sounds left lower lung Abdominal:     General: Bowel sounds are normal. There is no distension.     Palpations: Abdomen is soft.     Tenderness: There is no abdominal tenderness. There is no right CVA tenderness or left CVA tenderness.  Musculoskeletal:        General: Normal range of motion.     Cervical back: Normal range of motion.     Comments: No bony tenderness, compartments soft, no lower extremity edema  Skin:    General: Skin is warm and dry.     Capillary Refill: Capillary refill takes less than 2 seconds.     Comments: No obvious rashes or lesions  Neurological:     General: No focal deficit present.     Mental Status: She is alert.  Psychiatric:        Mood and Affect: Mood normal.    ED Results / Procedures / Treatments   Labs (all labs ordered are listed, but only abnormal results are displayed) Labs Reviewed  CBC WITH DIFFERENTIAL/PLATELET - Abnormal; Notable for the following components:      Result Value   Monocytes Absolute 1.2 (*)    All other components within normal limits  BASIC METABOLIC PANEL - Abnormal; Notable for the following components:   Sodium 131 (*)    CO2 21 (*)    Glucose, Bld 391 (*)    All other components within normal limits  CBG MONITORING, ED - Abnormal; Notable for the following components:   Glucose-Capillary 365 (*)    All other components within normal limits  I-STAT VENOUS BLOOD GAS, ED - Abnormal; Notable for the following components:   pH, Ven 7.448 (*)    pCO2, Ven 32.1 (*)    pO2, Ven 60 (*)    Sodium 132 (*)    All other components within normal limits  RESP PANEL BY RT-PCR (RSV, FLU A&B,  COVID)  [id number]  GROUP A STREP BY PCR  BRAIN NATRIURETIC PEPTIDE  URINALYSIS, ROUTINE W REFLEX MICROSCOPIC  TROPONIN I (HIGH SENSITIVITY)    EKG None  Radiology DG Chest 2 View  Result Date: 08/26/2022 CLINICAL DATA:  Fever and cough. EXAM: CHEST - 2 VIEW COMPARISON:  February 24, 2020 FINDINGS: The heart size and mediastinal contours are within normal limits. Low lung volumes are noted. There is mild to moderate severity prominence of the perihilar pulmonary vasculature. Mild bilateral suprahilar and bilateral infrahilar atelectatic changes are seen, slightly more prominent along the left lung base. No pleural effusion or pneumothorax is noted. The visualized skeletal structures are unremarkable. IMPRESSION: 1. Low lung volumes with mild to moderate severity  pulmonary vascular congestion. 2. Mild bilateral suprahilar and bilateral infrahilar atelectatic changes. Electronically Signed   By: Aram Candela M.D.   On: 08/26/2022 22:23    Procedures Procedures    Medications Ordered in ED Medications  ibuprofen (ADVIL) tablet 800 mg (has no administration in time range)  acetaminophen (TYLENOL) tablet 650 mg (650 mg Oral Given 08/26/22 2157)  sodium chloride 0.9 % bolus 1,000 mL (1,000 mLs Intravenous New Bag/Given 08/26/22 2315)   ED Course/ Medical Decision Making/ A&P   37 F here for evaluation of feeling well over the last week with 1 week of nonproductive cough, fever and sore throat over the last few days.  Eating and drinking normally at home.  Some polyuria, blood sugars have been elevated at home.  Some pleuritic left-sided pain and some shortness of breath.  No history of PE or DVT.  Arrival patient febrile, tachycardic however no antipyretic at home.  Does not appear grossly fluid overloaded. Course lung sounds on left. Will plan on labs, imaging and reassess.  Labs and imaging personally viewed and interpreted:  CBC without leukocytosis VBG normal bicarb, no acidosis>> Low  suspicion for DKA as cause of her tachycardia Chest xray with low lung volumes, mild or moderate vascular congestion Strep negative Troponin 11  Care transferred to Dr. Wilkie Aye who will follow-up on remaining workup and final disposition                           Medical Decision Making Amount and/or Complexity of Data Reviewed Independent Historian: spouse External Data Reviewed: labs, radiology, ECG and notes. Labs: ordered. Decision-making details documented in ED Course. Radiology: ordered and independent interpretation performed. Decision-making details documented in ED Course. ECG/medicine tests: ordered and independent interpretation performed. Decision-making details documented in ED Course.  Risk OTC drugs. Prescription drug management. Parenteral controlled substances. Decision regarding hospitalization. Diagnosis or treatment significantly limited by social determinants of health.         Final Clinical Impression(s) / ED Diagnoses Final diagnoses:  Acute cough  Hyperglycemia    Rx / DC Orders ED Discharge Orders     None         Ysenia Filice A, PA-C 08/26/22 2337    Shon Baton, MD 08/27/22 [phone]

## 2022-08-27 ENCOUNTER — Ambulatory Visit: Payer: 59

## 2022-08-27 DIAGNOSIS — J9621 Acute and chronic respiratory failure with hypoxia: Secondary | ICD-10-CM

## 2022-08-27 DIAGNOSIS — I10 Essential (primary) hypertension: Secondary | ICD-10-CM

## 2022-08-27 DIAGNOSIS — R051 Acute cough: Secondary | ICD-10-CM

## 2022-08-27 DIAGNOSIS — R6510 Systemic inflammatory response syndrome (SIRS) of non-infectious origin without acute organ dysfunction: Secondary | ICD-10-CM

## 2022-08-27 DIAGNOSIS — E109 Type 1 diabetes mellitus without complications: Secondary | ICD-10-CM

## 2022-08-27 LAB — BASIC METABOLIC PANEL
Anion gap: 11 (ref 5–15)
BUN: 14 mg/dL (ref 6–20)
CO2: 19 mmol/L — ABNORMAL LOW (ref 22–32)
Calcium: 8.4 mg/dL — ABNORMAL LOW (ref 8.9–10.3)
Chloride: 104 mmol/L (ref 98–111)
Creatinine, Ser: 0.8 mg/dL (ref 0.44–1.00)
GFR, Estimated: >60 mL/min (ref >60)
Glucose, Bld: 248 mg/dL — ABNORMAL HIGH (ref 70–99)
Potassium: 3.4 mmol/L — ABNORMAL LOW (ref 3.5–5.1)
Sodium: 134 mmol/L — ABNORMAL LOW (ref 135–145)

## 2022-08-27 LAB — CBC WITH DIFFERENTIAL/PLATELET
Abs Immature Granulocytes: 0.02 10*3/uL (ref 0.00–0.07)
Basophils Absolute: 0 10*3/uL (ref 0.0–0.1)
Basophils Relative: 0 %
Eosinophils Absolute: 0 10*3/uL (ref 0.0–0.5)
Eosinophils Relative: 1 %
HCT: 37.8 % (ref 36.0–46.0)
Hemoglobin: 11.8 g/dL — ABNORMAL LOW (ref 12.0–15.0)
Immature Granulocytes: 0 %
Lymphocytes Relative: 39 %
Lymphs Abs: 3 10*3/uL (ref 0.7–4.0)
MCH: 26 pg (ref 26.0–34.0)
MCHC: 31.2 g/dL (ref 30.0–36.0)
MCV: 83.4 fL (ref 80.0–100.0)
Monocytes Absolute: 0.9 10*3/uL (ref 0.1–1.0)
Monocytes Relative: 12 %
Neutro Abs: 3.8 10*3/uL (ref 1.7–7.7)
Neutrophils Relative %: 48 %
Platelets: 118 10*3/uL — ABNORMAL LOW (ref 150–400)
RBC: 4.53 MIL/uL (ref 3.87–5.11)
RDW: 14.3 % (ref 11.5–15.5)
WBC: 7.8 10*3/uL (ref 4.0–10.5)
nRBC: 0 % (ref 0.0–0.2)

## 2022-08-27 LAB — RESPIRATORY PANEL BY PCR

## 2022-08-27 LAB — TROPONIN I (HIGH SENSITIVITY): Troponin I (High Sensitivity): 6 ng/L (ref <18)

## 2022-08-27 LAB — GLUCOSE, CAPILLARY: Glucose-Capillary: 179 mg/dL — ABNORMAL HIGH (ref 70–99)

## 2022-08-27 LAB — PROCALCITONIN: Procalcitonin: 0.2 ng/mL

## 2022-08-27 LAB — STREP PNEUMONIAE URINARY ANTIGEN: Strep Pneumo Urinary Antigen: NEGATIVE

## 2022-08-27 LAB — MAGNESIUM: Magnesium: 1.8 mg/dL (ref 1.7–2.4)

## 2022-08-27 MED ORDER — IPRATROPIUM-ALBUTEROL 0.5-2.5 (3) MG/3ML IN SOLN: 3.0000 mL | RESPIRATORY_TRACT | Status: DC | PRN

## 2022-08-27 MED ORDER — BENZONATATE 100 MG PO CAPS: 100.0000 mg | ORAL_CAPSULE | Freq: Three times a day (TID) | ORAL | Status: DC

## 2022-08-27 MED ORDER — METOPROLOL TARTRATE 25 MG PO TABS
25.0000 mg | ORAL_TABLET | Freq: Once | ORAL | Status: AC
  Administered 2022-08-27: 25 mg via ORAL
  Filled 2022-08-27: qty 1

## 2022-08-27 MED ORDER — PREDNISONE 20 MG PO TABS
20.0000 mg | ORAL_TABLET | Freq: Every day | ORAL | Status: DC
  Administered 2022-08-28 – 2022-08-29 (×2): 20 mg via ORAL
  Filled 2022-08-27 (×2): qty 1

## 2022-08-27 MED ORDER — AMLODIPINE BESYLATE 5 MG PO TABS
5.0000 mg | ORAL_TABLET | Freq: Every day | ORAL | Status: DC
  Administered 2022-08-28 – 2022-08-29 (×2): 5 mg via ORAL
  Filled 2022-08-27 (×2): qty 1

## 2022-08-27 MED ORDER — ACETAMINOPHEN 325 MG PO TABS: 650.0000 mg | ORAL_TABLET | Freq: Four times a day (QID) | ORAL | Status: DC | PRN

## 2022-08-27 MED ORDER — AMLODIPINE BESYLATE 10 MG PO TABS
10.0000 mg | ORAL_TABLET | Freq: Every day | ORAL | Status: DC
  Administered 2022-08-27: 10 mg via ORAL
  Filled 2022-08-27: qty 1

## 2022-08-27 MED ORDER — INSULIN GLARGINE-YFGN 100 UNIT/ML ~~LOC~~ SOLN
14.0000 [IU] | Freq: Every day | SUBCUTANEOUS | Status: DC
  Administered 2022-08-27 – 2022-08-29 (×3): 14 [IU] via SUBCUTANEOUS
  Filled 2022-08-27 (×3): qty 0.14

## 2022-08-27 MED ORDER — HYDROCOD POLI-CHLORPHE POLI ER 10-8 MG/5ML PO SUER
5.0000 mL | Freq: Once | ORAL | Status: AC
  Administered 2022-08-27: 5 mL via ORAL
  Filled 2022-08-27: qty 5

## 2022-08-27 MED ORDER — SODIUM CHLORIDE 0.9 % IV SOLN
500.0000 mg | Freq: Once | INTRAVENOUS | Status: AC
  Administered 2022-08-27: 500 mg via INTRAVENOUS
  Filled 2022-08-27: qty 5

## 2022-08-27 MED ORDER — SODIUM CHLORIDE 0.9 % IV SOLN
500.0000 mg | INTRAVENOUS | Status: DC
  Administered 2022-08-27 – 2022-08-29 (×3): 500 mg via INTRAVENOUS
  Filled 2022-08-27 (×4): qty 5

## 2022-08-27 MED ORDER — BENZONATATE 100 MG PO CAPS
100.0000 mg | ORAL_CAPSULE | Freq: Three times a day (TID) | ORAL | Status: DC | PRN
  Administered 2022-08-27: 100 mg via ORAL
  Filled 2022-08-27: qty 1

## 2022-08-27 MED ORDER — SODIUM CHLORIDE 0.9 % IV SOLN: 2.0000 g | INTRAVENOUS | Status: DC

## 2022-08-27 MED ORDER — SODIUM CHLORIDE 0.9 % IV SOLN
1.0000 g | Freq: Once | INTRAVENOUS | Status: AC
  Administered 2022-08-27: 1 g via INTRAVENOUS
  Filled 2022-08-27: qty 10

## 2022-08-27 MED ORDER — METOPROLOL TARTRATE 25 MG PO TABS
25.0000 mg | ORAL_TABLET | Freq: Two times a day (BID) | ORAL | Status: DC
  Administered 2022-08-27: 25 mg via ORAL
  Filled 2022-08-27: qty 1

## 2022-08-27 MED ORDER — INSULIN GLARGINE-YFGN 100 UNIT/ML ~~LOC~~ SOLN
10.0000 [IU] | Freq: Every day | SUBCUTANEOUS | Status: DC
Start: 2022-08-27 — End: 2022-08-27

## 2022-08-27 MED ORDER — OXYCODONE HCL 5 MG PO TABS: 15.0000 mg | ORAL_TABLET | Freq: Two times a day (BID) | ORAL | Status: DC | PRN

## 2022-08-27 MED ORDER — POTASSIUM CHLORIDE CRYS ER 20 MEQ PO TBCR
40.0000 meq | EXTENDED_RELEASE_TABLET | Freq: Once | ORAL | Status: AC
  Administered 2022-08-27: 40 meq via ORAL
  Filled 2022-08-27: qty 2

## 2022-08-27 MED ORDER — ENOXAPARIN SODIUM 40 MG/0.4ML IJ SOSY
40.0000 mg | PREFILLED_SYRINGE | INTRAMUSCULAR | Status: DC
  Administered 2022-08-27 – 2022-08-29 (×3): 40 mg via SUBCUTANEOUS
  Filled 2022-08-27 (×3): qty 0.4

## 2022-08-27 MED ORDER — SODIUM CHLORIDE 0.9 % IV SOLN: Freq: Once | INTRAVENOUS | Status: AC

## 2022-08-27 MED ORDER — BENZONATATE 100 MG PO CAPS
100.0000 mg | ORAL_CAPSULE | Freq: Once | ORAL | Status: AC
  Administered 2022-08-27: 100 mg via ORAL
  Filled 2022-08-27: qty 1

## 2022-08-27 MED ORDER — SODIUM CHLORIDE 0.9 % IV SOLN
2.0000 g | INTRAVENOUS | Status: DC
  Administered 2022-08-28 – 2022-08-29 (×2): 2 g via INTRAVENOUS
  Filled 2022-08-27 (×2): qty 20

## 2022-08-27 MED ORDER — METOPROLOL TARTRATE 50 MG PO TABS
50.0000 mg | ORAL_TABLET | Freq: Two times a day (BID) | ORAL | Status: DC
  Administered 2022-08-27 – 2022-08-29 (×4): 50 mg via ORAL
  Filled 2022-08-27 (×4): qty 1

## 2022-08-27 MED ORDER — GUAIFENESIN 100 MG/5ML PO LIQD: 5.0000 mL | ORAL | Status: DC | PRN

## 2022-08-27 MED ORDER — INSULIN ASPART 100 UNIT/ML IJ SOLN: 0.0000 [IU] | Freq: Every day | INTRAMUSCULAR | Status: DC

## 2022-08-27 MED ORDER — ONDANSETRON HCL 4 MG/2ML IJ SOLN: 4.0000 mg | Freq: Four times a day (QID) | INTRAMUSCULAR | Status: DC | PRN

## 2022-08-27 MED ORDER — INSULIN ASPART 100 UNIT/ML IJ SOLN
0.0000 [IU] | Freq: Three times a day (TID) | INTRAMUSCULAR | Status: DC
  Administered 2022-08-27: 5 [IU] via SUBCUTANEOUS
  Administered 2022-08-27: 3 [IU] via SUBCUTANEOUS
  Administered 2022-08-27: 8 [IU] via SUBCUTANEOUS
  Administered 2022-08-28: 2 [IU] via SUBCUTANEOUS
  Administered 2022-08-28: 8 [IU] via SUBCUTANEOUS
  Administered 2022-08-28: 5 [IU] via SUBCUTANEOUS
  Administered 2022-08-29: 3 [IU] via SUBCUTANEOUS
  Administered 2022-08-29: 5 [IU] via SUBCUTANEOUS

## 2022-08-27 MED ORDER — IPRATROPIUM-ALBUTEROL 0.5-2.5 (3) MG/3ML IN SOLN
3.0000 mL | Freq: Once | RESPIRATORY_TRACT | Status: AC
  Administered 2022-08-27: 3 mL via RESPIRATORY_TRACT
  Filled 2022-08-27: qty 3

## 2022-08-27 MED ORDER — OXYCODONE HCL 5 MG PO TABS: 15.0000 mg | ORAL_TABLET | Freq: Four times a day (QID) | ORAL | Status: DC | PRN

## 2022-08-27 MED ORDER — DM-GUAIFENESIN ER 30-600 MG PO TB12
1.0000 | ORAL_TABLET | Freq: Two times a day (BID) | ORAL | Status: DC
  Administered 2022-08-27 – 2022-08-29 (×5): 1 via ORAL
  Filled 2022-08-27 (×5): qty 1

## 2022-08-27 NOTE — Progress Notes (Signed)
Hospitalist Transfer Note:  Transferring facility: [facility] Requesting provider: Dr. Ross Marcus (EDP at [facility]) Reason for transfer: admission for further evaluation and management of SIRS.     52 y.o. female w/ h/o SLE, ILD, who presented to [facility] ED complaining of fever/cough.  In the ED was noted to have a temperature of one 1.6, and has been persistently tachycardic.  However, no overt source of infection identified, including CTA chest, which showed no evidence of acute pulmonary embolism or any evidence of infiltrate.  Additionally, her COVID, flu, RSV PCR were all negative.  Urinalysis is not consistent with UTI.  She has remained normotensive throughout her ED course and is maintaining oxygen saturations in the mid 90s on room air while at rest.  However, she desaturates into the low 70s on room air with ambulation.  Blood cultures x 2 were collected and she was started on azithromycin and Rocephin for clinical suspicion of underlying pneumonia.   Subsequently, I accepted this patient for transfer for inpatient admission to a med/tele bed at  [facility] or [facility] (first available) for further work-up and management of the above .       Nursing staff, Please call [facility] Admits & Consults System-Wide number on Amion [phone]) as soon as patient's arrival, so appropriate admitting provider can evaluate the pt.     Newton Pigg, DO Hospitalist

## 2022-08-27 NOTE — Progress Notes (Signed)
PROGRESS NOTE  Andrea [name]  DOB: Nov 05, 1970  PCP: Jearld Lesch, MD WUX:[id number]  DOA: 08/26/2022  LOS: 0 days  Hospital Day: 2  Brief narrative: Andrea [name] is a 52 y.o. female with PMH significant for SLE, ILD, DM2, HTN  7/12, patient presented to the ED with complaint of dry fever, cough, shortness of breath, wheezing, hoarseness lightheadedness ongoing for a week.  Blood sugar levels was reading in the 300s for last several days  In the ED, patient had a fever of 101.6, heart rate 126, blood show 148/93, breathing on room air VBG with pH 7.45, pCO2 32 Labs with CBC unremarkable, sodium 131, glucose 391, troponin normal, BNP normal Respiratory virus panel unremarkable Chest x-ray with low lung volumes with mild to moderate severity pulmonary vascular congestion and mild bilateral suprahilar and bilateral infrahilar atelectatic changes. In the ED, on ambulation, her saturation dropped to 71% Blood culture sent She was started on IV Rocephin, azithromycin Admitted to [facility]  Subjective: Patient was seen and examined this am. Pleasant middle-aged African-American female.  Sitting up at the edge of the bed.  Not in distress.  Walked back from bathroom without supplemental oxygen.  No wheezing.   Continues to have hoarseness of voice Continues to remain tachycardic in 120s.  Boyfriend bedside. Chart reviewed Overnight fever subsided, tachycardia persists in 110s Labs this morning with sodium 134, potassium 3.4, glucose 248, procalcitonin 0.2  Assessment and plan: Sepsis POA Bilateral pneumonia Presented with worsening respiratory symptoms for a week Noted to have fever, tachycardia, hypoxia Chest x-ray findings suspicious for bilateral pneumonia Blood cultures sent Started on IV Rocephin, IV azithromycin Also continue Mucinex, Tessalon Perles Continue monitor for fever and WBC trend. Recent Labs  Lab 08/26/22 2216 08/27/22 0506 08/27/22 0616  WBC  9.9 7.8  --   PROCALCITON  --   --  0.20   Acute respiratory failure with hypoxia H/o ILD In the ED, O2 sat dropped down to 71% on ambulation. Started on supplemental oxygen.  Gradually weaned down.  Able to ambulate to the without oxygen today.  Hoarseness Significant hoarseness likely due to vocal cord irritation from persistent cough.  Started on steroids.  Persistent tachycardia Sinus.  Likely physiological in response to sepsis Continue metoprolol as before Continue to monitor on telemetry  Essential hypertension PTA meds-Lopressor 25 mg twice daily, Norvasc 10 mg daily Continue both.  Type 2 diabetes mellitus with hyperglycemia A1c 7.3 from 2013.  Update A1c PTA meds-metformin 1000 mg twice daily.  Currently on hold Given Lantus 14 units this morning. Continue SSI/Accu-Cheks Lab Results  Component Value Date   HGBA1C 7.3 (H) 12/18/2011   Recent Labs  Lab 08/26/22 2306  GLUCAP 365*   H/o SLE Not on meds  Hypokalemia Potassium level low at 3.4 this morning.  Oral replacement given.   Recent Labs  Lab 08/26/22 2216 08/26/22 2242 08/27/22 0506  K 3.8 3.7 3.4*  MG  --   --  1.8    Hyponatremia Sodium level mildly low.  Improving.  Continue to monitor Recent Labs  Lab 08/26/22 2216 08/26/22 2242 08/27/22 0506  NA 131* 132* 134*      Mobility: Encourage ambulation  Goals of care   Code Status: Full Code     DVT prophylaxis:  enoxaparin (LOVENOX) injection 40 mg Start: 08/27/22 1000   Antimicrobials: IV Rocephin, azithromycin Fluid: None Consultants: None Family Communication: Significant other at bedside  Status: Inpatient Level of care:  Telemetry  Patient from: Home Anticipated d/c to: Hopefully home in 1 to 2 days Needs to continue in-hospital care:  Needs IV steroids, further inpatient monitoring       Diet:  Diet Order             Diet regular Room service appropriate? Yes; Fluid consistency: Thin  Diet effective now                    Scheduled Meds:  [START ON 08/28/2022] amLODipine  5 mg Oral Daily   dextromethorphan-guaiFENesin  1 tablet Oral BID   enoxaparin (LOVENOX) injection  40 mg Subcutaneous Q24H   insulin aspart  0-15 Units Subcutaneous TID WC   insulin aspart  0-5 Units Subcutaneous QHS   insulin glargine-yfgn  14 Units Subcutaneous Daily   metoprolol tartrate  50 mg Oral BID   potassium chloride  40 mEq Oral Once   [START ON 08/28/2022] predniSONE  20 mg Oral Q breakfast    PRN meds: acetaminophen, benzonatate, ipratropium-albuterol, ondansetron (ZOFRAN) IV, oxyCODONE   Infusions:   azithromycin 500 mg (08/27/22 1027)   [START ON 08/28/2022] cefTRIAXone (ROCEPHIN)  IV      Antimicrobials: Anti-infectives (From admission, onward)    Start     Dose/Rate Route Frequency Ordered Stop   08/28/22 0000  cefTRIAXone (ROCEPHIN) 2 g in sodium chloride 0.9 % 100 mL IVPB        2 g 200 mL/hr over 30 Minutes Intravenous Every 24 hours 08/27/22 0601 09/01/22 2359   08/27/22 1000  cefTRIAXone (ROCEPHIN) 2 g in sodium chloride 0.9 % 100 mL IVPB  Status:  Discontinued        2 g 200 mL/hr over 30 Minutes Intravenous Every 24 hours 08/27/22 0459 08/27/22 0601   08/27/22 1000  azithromycin (ZITHROMAX) 500 mg in sodium chloride 0.9 % 250 mL IVPB        500 mg 250 mL/hr over 60 Minutes Intravenous Every 24 hours 08/27/22 0459 09/01/22 0959   08/27/22 0230  cefTRIAXone (ROCEPHIN) 1 g in sodium chloride 0.9 % 100 mL IVPB        1 g 200 mL/hr over 30 Minutes Intravenous  Once 08/27/22 0216 08/27/22 0325   08/27/22 0230  azithromycin (ZITHROMAX) 500 mg in sodium chloride 0.9 % 250 mL IVPB        500 mg 250 mL/hr over 60 Minutes Intravenous  Once 08/27/22 0216 08/27/22 1226       Nutritional status:  Body mass index is 40.35 kg/m.          Objective: Vitals:   08/27/22 1021 08/27/22 1355  BP: 124/89 (!) 133/94  Pulse: (!) 115 (!) 101  Resp: 20 20  Temp:    SpO2: 99% 97%     Intake/Output Summary (Last 24 hours) at 08/27/2022 1415 Last data filed at 08/27/2022 1100 Gross per 24 hour  Intake 1440.89 ml  Output 200 ml  Net 1240.89 ml   Filed Weights   08/26/22 2153  Weight: 113.4 kg   Weight change:  Body mass index is 40.35 kg/m.   Physical Exam: General exam: Pleasant, middle-aged African-American female. Skin: No rashes, lesions or ulcers. HEENT: Atraumatic, normocephalic, no obvious bleeding Lungs: Clear to auscultation bilaterally.  Coughs on deep breathing.  No wheezing CVS: Regular rate and rhythm, no murmur GI/Abd soft, nontender, nondistended, bowel sound present CNS: Alert, awake, oriented x 3 Psychiatry: Mood appropriate Extremities: No pedal edema, no calf redness  Data Review: I have  personally reviewed the laboratory data and studies available.  F/u labs ordered Unresulted Labs (From admission, onward)     Start     Ordered   09/03/22 0500  Creatinine, serum  (enoxaparin (LOVENOX)    CrCl >/= 30 ml/min)  Weekly,   R     Comments: while on enoxaparin therapy    08/27/22 0459   08/27/22 0544  Hemoglobin A1c  Once,   R       Comments: To assess prior glycemic control    08/27/22 0544   08/27/22 0458  Legionella Pneumophila Serogp 1 Ur Ag  Once,   R        08/27/22 0459   08/27/22 0222  Blood culture (routine x 2)  BLOOD CULTURE X 2,   R,   Status:  Canceled      08/27/22 0221            Total time spent in review of labs and imaging, patient evaluation, formulation of plan, documentation and communication with family: 45 minutes  Signed, Lorin Glass, MD [facility] 08/27/2022

## 2022-08-27 NOTE — ED Notes (Signed)
Received patient from C.Lozer,RN.Pt. a&o x 4, O2 at 2l/min. Pt. Still coughing.

## 2022-08-27 NOTE — Progress Notes (Signed)
MEWS Progress Note  Patient Details Name: Kamilly Heiserman MRN: [id number] DOB: Jul 29, 1970 Today's Date: 08/27/2022   MEWS Flowsheet Documentation:  Assess: MEWS Score Temp: (!) 97.5 F (36.4 C) BP: 124/89 MAP (mmHg): 100 Pulse Rate: (!) 115 ECG Heart Rate: (!) 117 Resp: 20 Level of Consciousness: Alert SpO2: 99 % O2 Device: Nasal Cannula Patient Activity (if Appropriate): In bed O2 Flow Rate (L/min): 2 L/min FiO2 (%): (S) 28 % Assess: MEWS Score MEWS Temp: 0 MEWS Systolic: 0 MEWS Pulse: 2 MEWS RR: 0 MEWS LOC: 0 MEWS Score: 2 MEWS Score Color: Yellow Assess: SIRS CRITERIA SIRS Temperature : 0 SIRS Respirations : 0 SIRS Pulse: 1 SIRS WBC: 0 SIRS Score Sum : 1   Provider Notification Provider Name/Title: Lorin Glass Date Provider Notified: 08/27/22 Time Provider Notified: 1024 Method of Notification: Page Notification Reason: Change in status Provider response: At bedside Date of Provider Response: 08/27/22 Time of Provider Response: 1116 Notify: Rapid Response Name of Rapid Response RN Notified: N/A      Mcarthur Ivins 08/27/2022, 11:22 AM

## 2022-08-27 NOTE — Progress Notes (Signed)
Glucometer wasn't able to transfer the data so manual entry of CBG is done.

## 2022-08-27 NOTE — ED Notes (Signed)
RT ambulated pt as ordered. Pt initial SPO2 on RA at beginning of walk 93%, [place] ambulation pt SpO2 decreased to 87%, End of ambulation pt was at 71% SpO2 on RA. Pt respiratory status remained stable w/increased dyspnea and some increased WOB. Pt placed back on monitor w/steady recovery and SpO2 of 95% on RA. MD made aware post ambulation results.    08/27/22 0215  Oxygen Therapy/Pulse Ox  O2 Device Room Air  O2 Therapy Room air  SpO2 (!) (S)  71 % (MD made aware during ambulations as ordered)

## 2022-08-27 NOTE — Plan of Care (Signed)

## 2022-08-27 NOTE — H&P (Signed)
PCP:   Jearld Lesch, MD   Chief Complaint:  Cough, hypoxia  HPI: This is a 52 year old female with past medical history SLE, ILD, HTN, T1 DM.  Approximate 1 week ago she developed a dry cough that has been persistent.  She had associated shortness of breath, fever, chills, weakness, lightheadedness, dizziness and wheezing.  She denies nausea vomiting or myalgia.  She additionally endorses frequent urination, she denies burning urination.  Her FSBS has been in the 300s, this is her baseline.  Patient does not adhere to diabetic diet.  She has been hoarse last 3 days.  She went to [facility] ER  In the ER patient febrile Tmax 101.6.  CT chest, chest x-ray, flu, COVID, RSV panel negative.  With ambulation patient desatted as low as 71%.  Admission requested for treatment of pneumonia.  Patient treated with Rocephin and azithromycin.  Blood cultures x 2 ordered.  Ibuprofen given.  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 Authorizing Provider  albuterol (VENTOLIN HFA) 108 (90 Base) MCG/ACT inhaler Inhale 1 - 2 puffs by mouth every 4 hours as needed Patient not taking: Reported on 05/03/2022 01/06/21     ALPRAZolam (XANAX) 0.25 MG tablet Take 0.25 mg by mouth 3 (three) times daily as needed. anxiety Patient not taking: Reported on 02/26/2020    [provider]  amLODipine (NORVASC) 10 MG tablet Take 1 tablet (10 mg total)  by mouth daily. 08/09/22     amLODipine (NORVASC) 5 MG tablet Take 5 mg by mouth daily. 01/03/20   [provider]  amLODipine (NORVASC) 5 MG tablet TAKE 1 TABLET BY MOUTH ONCE A DAY 03/26/20 04/19/21  Treasa School, PA-C  amLODipine (NORVASC) 5 MG tablet Take 1 tablet (5 mg) by mouth once daily. 01/06/21     amLODipine (NORVASC) 5 MG tablet Take 1 tablet (5 mg) by mouth daily 02/15/22     amLODipine (NORVASC) 5 MG tablet Take 1 tablet (5 mg total) by mouth daily. 06/17/22     azithromycin (ZITHROMAX Z-PAK) 250 MG tablet Take 2 tablets by mouth on day 1, than take 1 tablet once daily on days 2-5. Patient not taking: Reported on 05/03/2022 03/07/21     benzonatate (TESSALON) 100 MG capsule Take 1 capsule (100 mg total) by mouth 3 (three) times daily as needed for cough. 07/12/12   Parrett, Virgel Bouquet, NP  benzonatate (TESSALON) 200 MG capsule take 1 capsule (200 mg) by oral route 3 times per day as needed for cough 05/22/20     benzonatate (TESSALON) 200 MG capsule Take 1 capsule (200 mg total) by mouth 3 (three) times daily as needed for cough 08/14/20     benzonatate (TESSALON) 200 MG capsule Take 1 capsule (200 mg total) by mouth 3 (three) times daily as needed for cough 01/06/21     Blood Glucose Monitoring Suppl (ACCU-CHEK AVIVA PLUS) w/Device KIT Use  as directed 3 times a day 04/06/21     celecoxib (CELEBREX) 200 MG capsule Take 1 capsule (200 mg total) by mouth daily. Patient not taking: Reported on 05/03/2022 03/08/21     cyclobenzaprine (FLEXERIL) 10 MG tablet Take 1 tablet (10 mg total) by mouth 2 (two) times daily as needed for muscle spasms. Patient not taking: Reported on 02/26/2020 10/25/19   Shanon Ace, PA-C  empagliflozin (JARDIANCE) 25 MG TABS tablet Take 1 tablet by mouth daily Patient not taking: Reported on 05/03/2022 06/29/21     empagliflozin (JARDIANCE) 25 MG TABS tablet Take 1 tablet (25 mg) by mouth daily Patient not taking: Reported on 05/03/2022 10/19/21     fluconazole  (DIFLUCAN) 150 MG tablet Take 1 tablet by mouth once. May repeat in 5 days if needed. Patient not taking: Reported on 05/03/2022 09/11/20     fluconazole (DIFLUCAN) 150 MG tablet Take 1 tablet (150 mg total) by mouth for 1 dose.  May repeat in 5 days if needed 01/06/21     fluconazole (DIFLUCAN) 150 MG tablet Take 1 tablet (150 mg) by mouth one time Patient not taking: Reported on 05/03/2022 02/15/22     gabapentin (NEURONTIN) 300 MG capsule 300mg  once daily x 3 days, then 300mg  twice daily x 3 days, then 300mg  three times daily to continue Patient not taking: Reported on 02/26/2020 01/18/12   Kalman Shan, MD  glucose blood (ACCU-CHEK AVIVA PLUS) test strip 3 times a day 04/06/21     hydroxychloroquine (PLAQUENIL) 200 MG tablet Take by mouth daily. Patient not taking: Reported on 02/26/2020    [provider]  Lancets Misc. (ACCU-CHEK FASTCLIX LANCET) KIT Use as directed 3 times a day 04/06/21     levofloxacin (LEVAQUIN) 500 MG tablet Take 1 tablet (500 mg) by mouth daily Patient not taking: Reported on 05/03/2022 09/21/21     metFORMIN (FORTAMET) 1000 MG (OSM) 24 hr tablet Take 1 tablet (1,000 mg total) by mouth daily with breakfast. Patient not taking: Reported on 02/26/2020 12/20/11   Cristal Ford, MD  metFORMIN (GLUCOPHAGE) 1000 MG tablet Take 1 tablet (1,000 mg total) by mouth 2 (two) times daily with meals. Patient not taking: Reported on 05/03/2022 11/23/21     metFORMIN (GLUCOPHAGE) 1000 MG tablet Take 1 tablet (1,000 mg) by mouth twice a day with meals. 08/09/22     metFORMIN (GLUCOPHAGE) 500 MG tablet Take 1 tablet by mouth 2 times a day with meals Patient not taking: Reported on 05/03/2022 04/06/21     metFORMIN (GLUCOPHAGE) 850 MG tablet Take 1 tablet by mouth 2 times daily with a meal 05/04/21     metFORMIN (GLUCOPHAGE) 850 MG tablet Take 1 tablet (850 mg) by mouth 2 times per day with meals 10/19/21     metFORMIN (GLUCOPHAGE) 850 MG tablet Take 1 tablet (850 mg total) by mouth 2 (two)  times daily with a meal. 03/18/22     metFORMIN (GLUCOPHAGE) 850 MG tablet Take 1 tablet by mouth 2 times per day with meals 07/12/22     metoprolol (LOPRESSOR) 50 MG tablet Take 50 mg by mouth 2 (two) times daily.    [provider]  metoprolol tartrate (LOPRESSOR) 25 MG tablet TAKE 1 TABLET BY MOUTH 2 TIMES DAILY 03/26/20 03/26/21  Treasa School, PA-C  metoprolol tartrate (LOPRESSOR) 25 MG tablet Take 1 tablet (25 mg) by mouth 2 times daily. 12/09/20     metoprolol tartrate (LOPRESSOR) 25 MG tablet Take 1 tablet (25 mg) by  oral route 2 times per day 01/06/21     metoprolol tartrate (LOPRESSOR) 25 MG tablet Take 1 tablet (25 mg) by mouth 2 times per day with food 10/19/21     metoprolol tartrate (LOPRESSOR) 25 MG tablet Take 1 tablet (25 mg) by mouth with food 2 times per day 07/12/22     metoprolol tartrate (LOPRESSOR) 50 MG tablet Take 2 tablets (100 mg total) by mouth 2 (two) times daily with a meal. 08/09/22     naloxone (NARCAN) nasal spray 4 mg/0.1 mL Inhale 1 spray (4 mg) into nostril one time as directed 08/24/21     oxyCODONE (OXY IR/ROXICODONE) 5 MG immediate release tablet Take 1 tablet by mouth every six hours as needed 02/04/21     oxyCODONE (ROXICODONE) 15 MG immediate release tablet Take 1 tablet (15 mg) by mouth every 6 hours as needed for pain 07/16/20     oxyCODONE (ROXICODONE) 15 MG immediate release tablet Take 1 tablet (15 mg total) by mouth every 6 (six) hours as needed for pain 08/14/20     oxyCODONE (ROXICODONE) 15 MG immediate release tablet Take 1 tablet (15 mg) by mouth every 6 hours as needed for pain. 12/09/20     oxyCODONE (ROXICODONE) 15 MG immediate release tablet Take 1 tablet (15 mg total) by mouth every 6 (six) hours as needed for pain 01/06/21     oxyCODONE (ROXICODONE) 15 MG immediate release tablet Take 1 tablet (15 mg) by mouth every 6 hours 08/24/21     oxyCODONE (ROXICODONE) 15 MG immediate release tablet Take 1 tablet (15 mg total) by mouth every 6 (six) hours.  11/23/21     oxyCODONE (ROXICODONE) 15 MG immediate release tablet Take 1 tablet (15 mg total) by mouth every 6 (six) hours. 01/18/22     oxyCODONE (ROXICODONE) 15 MG immediate release tablet Take 1 tablet (15 mg total) by mouth every 6 (six) hours. 06/17/22     oxyCODONE (ROXICODONE) 15 MG immediate release tablet Take 1 tablet (15 mg total) by mouth every 6 hours. 08/09/22     Oxycodone HCl 10 MG TABS Take 1 tablet every 6 hours as needed 03/08/21     predniSONE (DELTASONE) 5 MG tablet Take 1 tablet by mouth daily. 03/07/21     predniSONE (DELTASONE) 5 MG tablet Take 1 tablet (5 mg total) by mouth daily. 05/20/22     predniSONE (STERAPRED UNI-PAK 21 TAB) 5 MG (21) TBPK tablet Take as directed on package 01/06/21     promethazine-codeine (PHENERGAN WITH CODEINE) 6.25-10 MG/5ML syrup Take 5 mLs by mouth every 4 (four) hours as needed for cough. Patient not taking: Reported on 02/26/2020 01/26/12   Parrett, Virgel Bouquet, NP  promethazine-dextromethorphan (PROMETHAZINE-DM) 6.25-15 MG/5ML syrup Take 5 mLs by mouth every 4 to 6 hours as needed for cough. 03/07/21     promethazine-dextromethorphan (PROMETHAZINE-DM) 6.25-15 MG/5ML syrup Take 5 ml by mouth every 6 hours as needed 05/20/22     Semaglutide, 1 MG/DOSE, (OZEMPIC, 1 MG/DOSE,) 4 MG/3ML SOPN Inject 1 mg into the skin weekly. 09/21/21     Semaglutide,0.25 or 0.5MG /DOS, (OZEMPIC, 0.25 OR 0.5 MG/DOSE,) 2 MG/3ML SOPN Inject 0.5 mg under the skin weekly. 08/24/21       Allergies:  No Known Allergies  Social History:  reports that she has never smoked. She has never used smokeless tobacco. She reports current alcohol use of about 2.0 standard drinks of alcohol per week. She reports that she does not use drugs.  Family History:  Family History  Problem Relation Age of Onset   Lupus Mother        deceased   Heart attack Father        deceased   Breast cancer Neg Hx     Physical Exam: Vitals:   08/27/22 0315 08/27/22 0330 08/27/22 0345 08/27/22 0503  BP:  112/77 117/81 119/88 124/74  Pulse: (!) 116 (!) 115 (!) 116 (!) 110  Resp: (!) 26 (!) 27 (!) 29 18  Temp:    97.9 F (36.6 C)  TempSrc:    Oral  SpO2: 97% 93% 92% 100%  Weight:      Height:        General:  Alert and oriented times three, morbidly obese, no acute distress Eyes: Pink conjunctiva, no scleral icterus ENT: Moist oral mucosa, neck supple, no thyromegaly Lungs: clear to ascultation, no wheeze, no crackles, no use of accessory muscles Cardiovascular: Tachycardia, regular rate and rhythm, no regurgitation, no gallops, no murmurs. No carotid bruits, no JVD Abdomen: soft, positive BS, NTND, not an acute abdomen GU: not examined Neuro: CN II - XII grossly intact, sensation intact Musculoskeletal: strength 5/5 all extremities, no clubbing, cyanosis or edema Skin: no rash, no subcutaneous crepitation, no decubitus Psych: appropriate patient  Labs on Admission:  Recent Labs    08/26/22 2216 08/26/22 2242  NA 131* 132*  K 3.8 3.7  CL 98  --   CO2 21*  --   GLUCOSE 391*  --   BUN 12  --   CREATININE 0.84  --   CALCIUM 9.0  --    Recent Labs    08/26/22 2216 08/26/22 2242  WBC 9.9  --   NEUTROABS 5.2  --   HGB 12.0 12.9  HCT 37.0 38.0  MCV 82.8  --   PLT 186  --      Micro Results: Recent Results (from the past 240 hour(s))  Resp panel by RT-PCR (RSV, Flu A&B, Covid) Anterior Nasal Swab     Status: None   Collection Time: 08/26/22  9:58 PM   Specimen: Anterior Nasal Swab  Result Value Ref Range Status   SARS Coronavirus 2 by RT PCR NEGATIVE NEGATIVE Final    Comment: (NOTE) SARS-CoV-2 target nucleic acids are NOT DETECTED.  The SARS-CoV-2 RNA is generally detectable in upper respiratory specimens during the acute phase of infection. The lowest concentration of SARS-CoV-2 viral copies this assay can detect is 138 copies/mL. A negative result does not preclude SARS-Cov-2 infection and should not be used as the sole basis for treatment or other patient  management decisions. A negative result may occur with  improper specimen collection/handling, submission of specimen other than nasopharyngeal swab, presence of viral mutation(s) within the areas targeted by this assay, and inadequate number of viral copies(<138 copies/mL). A negative result must be combined with clinical observations, patient history, and epidemiological information. The expected result is Negative.  Fact Sheet for Patients:  BloggerCourse.com  Fact Sheet for Healthcare Providers:  SeriousBroker.it  This test is no t yet approved or cleared by the [place] FDA and  has been authorized for detection and/or diagnosis of SARS-CoV-2 by FDA under an Emergency Use Authorization (EUA). This EUA will remain  in effect (meaning this test can be used) for the duration of the COVID-19 declaration under Section 564(b)(1) of the Act, 21 U.S.C.section 360bbb-3(b)(1), unless the authorization is terminated  or revoked sooner.       Influenza A by PCR NEGATIVE NEGATIVE  Final   Influenza B by PCR NEGATIVE NEGATIVE Final    Comment: (NOTE) The Xpert Xpress SARS-CoV-2/FLU/RSV plus assay is intended as an aid in the diagnosis of influenza from Nasopharyngeal swab specimens and should not be used as a sole basis for treatment. Nasal washings and aspirates are unacceptable for Xpert Xpress SARS-CoV-2/FLU/RSV testing.  Fact Sheet for Patients: BloggerCourse.com  Fact Sheet for Healthcare Providers: SeriousBroker.it  This test is not yet approved or cleared by the [place] FDA and has been authorized for detection and/or diagnosis of SARS-CoV-2 by FDA under an Emergency Use Authorization (EUA). This EUA will remain in effect (meaning this test can be used) for the duration of the COVID-19 declaration under Section 564(b)(1) of the Act, 21 U.S.C. section 360bbb-3(b)(1),  unless the authorization is terminated or revoked.     Resp Syncytial Virus by PCR NEGATIVE NEGATIVE Final    Comment: (NOTE) Fact Sheet for Patients: BloggerCourse.com  Fact Sheet for Healthcare Providers: SeriousBroker.it  This test is not yet approved or cleared by the [place] FDA and has been authorized for detection and/or diagnosis of SARS-CoV-2 by FDA under an Emergency Use Authorization (EUA). This EUA will remain in effect (meaning this test can be used) for the duration of the COVID-19 declaration under Section 564(b)(1) of the Act, 21 U.S.C. section 360bbb-3(b)(1), unless the authorization is terminated or revoked.  Performed at [facility], [address] [place], [place] [address]   Group A Strep by PCR     Status: None   Collection Time: 08/26/22 10:36 PM   Specimen: Throat; Sterile Swab  Result Value Ref Range Status   Group A Strep by PCR NOT DETECTED NOT DETECTED Final    Comment: Performed at [facility], [address] [place], [place] [address]     Radiological Exams on Admission: CT Angio Chest PE W and/or Wo Contrast  Result Date: 08/27/2022 CLINICAL DATA:  Cough and fever. EXAM: CT ANGIOGRAPHY CHEST WITH CONTRAST TECHNIQUE: Multidetector CT imaging of the chest was performed using the standard protocol during bolus administration of intravenous contrast. Multiplanar CT image reconstructions and MIPs were obtained to evaluate the vascular anatomy. RADIATION DOSE REDUCTION: This exam was performed according to the departmental dose-optimization program which includes automated exposure control, adjustment of the mA and/or kV according to patient size and/or use of iterative reconstruction technique. CONTRAST:  OMNIPAQUE IOHEXOL 350 MG/ML SOLN COMPARISON:  December 18, 2011 FINDINGS: Cardiovascular: Satisfactory opacification of the pulmonary arteries to the  segmental level. No evidence of pulmonary embolism. Normal heart size with mild coronary artery calcification. No pericardial effusion. Mediastinum/Nodes: No enlarged mediastinal, hilar, or axillary lymph nodes. Thyroid gland, trachea, and esophagus demonstrate no significant findings. Lungs/Pleura: Predominantly stable moderate severity chronic interstitial disease is again seen along the anteromedial aspects of the bilateral upper lobes and bilateral lung bases. Superimposed components of scarring and/or atelectasis are suspected. No pleural effusion or pneumothorax is identified. Upper Abdomen: There is diffuse fatty infiltration of the liver parenchyma. Musculoskeletal: No chest wall abnormality. No acute or significant osseous findings. Review of the MIP images confirms the above findings. IMPRESSION: 1. No evidence of pulmonary embolism. 2. Moderate severity chronic interstitial disease with superimposed components of scarring and/or atelectasis. 3. Fatty liver. Electronically Signed   By: Aram Candela M.D.   On: 08/27/2022 00:39   DG Chest 2 View  Result Date: 08/26/2022 CLINICAL DATA:  Fever and cough. EXAM: CHEST - 2 VIEW COMPARISON:  February 24, 2020  FINDINGS: The heart size and mediastinal contours are within normal limits. Low lung volumes are noted. There is mild to moderate severity prominence of the perihilar pulmonary vasculature. Mild bilateral suprahilar and bilateral infrahilar atelectatic changes are seen, slightly more prominent along the left lung base. No pleural effusion or pneumothorax is noted. The visualized skeletal structures are unremarkable. IMPRESSION: 1. Low lung volumes with mild to moderate severity pulmonary vascular congestion. 2. Mild bilateral suprahilar and bilateral infrahilar atelectatic changes. Electronically Signed   By: Aram Candela M.D.   On: 08/26/2022 22:23    Assessment/Plan Present on Admission:  SIRS (systemic inflammatory response syndrome)  (HCC)  Acute respiratory failure with hypoxia. -On 2 L oxygen -Treating for pneumonia.  Pneumonia order set initiated -Blood cultures x 2, Rocephin and azithromycin ordered -Mucinex DM, twice daily ordered, Tessalon Perles as needed -DuoNebs as needed   T1DM -Sliding scale insulin ordered -Lantus 15 units daily   HTN -Stable, Norvasc, Lopressor resumed   ILD  SLE -Not on meds  Andrea [name] 08/27/2022, 5:30 AM

## 2022-08-27 NOTE — ED Notes (Signed)
Post ambulation on RA.    08/27/22 0221  Oxygen Therapy/Pulse Ox  O2 Device Room Air  O2 Therapy Room air  SpO2 (S)  97 %

## 2022-08-28 LAB — BASIC METABOLIC PANEL
Anion gap: 9 (ref 5–15)
BUN: 13 mg/dL (ref 6–20)
CO2: 24 mmol/L (ref 22–32)
Calcium: 9 mg/dL (ref 8.9–10.3)
Chloride: 104 mmol/L (ref 98–111)
Creatinine, Ser: 0.65 mg/dL (ref 0.44–1.00)
GFR, Estimated: >60 mL/min (ref >60)
Glucose, Bld: 194 mg/dL — ABNORMAL HIGH (ref 70–99)
Potassium: 4.2 mmol/L (ref 3.5–5.1)
Sodium: 137 mmol/L (ref 135–145)

## 2022-08-28 LAB — CBC WITH DIFFERENTIAL/PLATELET
Abs Immature Granulocytes: 0.12 10*3/uL — ABNORMAL HIGH (ref 0.00–0.07)
Basophils Absolute: 0 10*3/uL (ref 0.0–0.1)
Basophils Relative: 0 %
Eosinophils Absolute: 0.2 10*3/uL (ref 0.0–0.5)
Eosinophils Relative: 2 %
HCT: 36.4 % (ref 36.0–46.0)
Hemoglobin: 11 g/dL — ABNORMAL LOW (ref 12.0–15.0)
Immature Granulocytes: 2 %
Lymphocytes Relative: 32 %
Lymphs Abs: 2.6 10*3/uL (ref 0.7–4.0)
MCH: 26.5 pg (ref 26.0–34.0)
MCHC: 30.2 g/dL (ref 30.0–36.0)
MCV: 87.7 fL (ref 80.0–100.0)
Monocytes Absolute: 0.8 10*3/uL (ref 0.1–1.0)
Monocytes Relative: 10 %
Neutro Abs: 4.4 10*3/uL (ref 1.7–7.7)
Neutrophils Relative %: 54 %
Platelets: 143 10*3/uL — ABNORMAL LOW (ref 150–400)
RBC: 4.15 MIL/uL (ref 3.87–5.11)
RDW: 14.5 % (ref 11.5–15.5)
WBC: 8.2 10*3/uL (ref 4.0–10.5)
nRBC: 0 % (ref 0.0–0.2)

## 2022-08-28 LAB — GLUCOSE, CAPILLARY
Glucose-Capillary: 143 mg/dL — ABNORMAL HIGH (ref 70–99)
Glucose-Capillary: 267 mg/dL — ABNORMAL HIGH (ref 70–99)

## 2022-08-28 MED ORDER — HYDROCOD POLI-CHLORPHE POLI ER 10-8 MG/5ML PO SUER
5.0000 mL | Freq: Once | ORAL | Status: AC
  Administered 2022-08-28: 5 mL via ORAL
  Filled 2022-08-28: qty 5

## 2022-08-28 MED ORDER — FUROSEMIDE 10 MG/ML IJ SOLN
20.0000 mg | Freq: Once | INTRAMUSCULAR | Status: AC
  Administered 2022-08-28: 20 mg via INTRAVENOUS
  Filled 2022-08-28: qty 2

## 2022-08-28 NOTE — Progress Notes (Signed)
PROGRESS NOTE  Andrea [name]  DOB: [birth year]  PCP: Jearld Lesch, MD ZOX:[id number]  DOA: 08/26/2022  LOS: 1 day  Hospital Day: 3  Brief narrative: Andrea [name] is a 52 y.o. female with PMH significant for SLE, ILD, DM2, HTN  7/12, patient presented to the ED with complaint of dry fever, cough, shortness of breath, wheezing, hoarseness lightheadedness ongoing for a week.  Blood sugar levels was reading in the 300s for last several days  In the ED, patient had a fever of 101.6, heart rate 126, blood show 148/93, breathing on room air VBG with pH 7.45, pCO2 32 Labs with CBC unremarkable, sodium 131, glucose 391, troponin normal, BNP normal Respiratory virus panel unremarkable Chest x-ray with low lung volumes with mild to moderate severity pulmonary vascular congestion and mild bilateral suprahilar and bilateral infrahilar atelectatic changes. In the ED, on ambulation, her saturation dropped to 71% Blood culture sent She was started on IV Rocephin, azithromycin Admitted to [facility]  Subjective: Patient was seen and examined this am. She has been on low-flow oxygen since last night. On ambulation this morning, her saturation dipped down to 70s.  Assessment and plan: Sepsis POA Bilateral pneumonia Presented with worsening respiratory symptoms for a week Noted to have fever, tachycardia, hypoxia Chest x-ray findings suspicious for bilateral pneumonia Blood cultures sent Started on IV Rocephin, IV azithromycin Patient feels better at rest but on ambulation, she was significantly hypoxic. Continue antibiotics, continue prednisone. Also continue Mucinex, Tessalon Perles I will give 1 dose of Lasix IV 20 mg today.  Obtain echocardiogram. Continue monitor for fever and WBC trend. Recent Labs  Lab 08/26/22 2216 08/27/22 0506 08/27/22 0616 08/28/22 0457  WBC 9.9 7.8  --  8.2  PROCALCITON  --   --  0.20  --    Acute respiratory failure with hypoxia H/o  ILD Today, O2 sat dropped down to 71% on ambulation. Started on supplemental oxygen.    Hoarseness Significant hoarseness likely due to vocal cord irritation from persistent cough.  Started on steroids.  Gradually improving.  Persistent tachycardia Sinus.  Likely physiological in response to sepsis Continue metoprolol as before Continue to monitor on telemetry  Essential hypertension PTA meds-Lopressor 25 mg twice daily, Norvasc 10 mg daily Continue both.  Type 2 diabetes mellitus with hyperglycemia A1c 7.3 from 2013.  Update A1c PTA meds-metformin 1000 mg twice daily.  Currently on hold Given Lantus 14 units this morning. Continue SSI/Accu-Cheks Lab Results  Component Value Date   HGBA1C 7.3 (H) 12/18/2011   Recent Labs  Lab 08/26/22 2306 08/27/22 2114 08/28/22 0741 08/28/22 1155  GLUCAP 365* 179* 143* 267*   H/o SLE Not on meds  Hypokalemia Potassium level low at 3.4 this morning.  Oral replacement given.   Recent Labs  Lab 08/26/22 2216 08/26/22 2242 08/27/22 0506 08/28/22 0457  K 3.8 3.7 3.4* 4.2  MG  --   --  1.8  --     Hyponatremia Sodium level mildly low.  Improving.  Continue to monitor Recent Labs  Lab 08/26/22 2216 08/26/22 2242 08/27/22 0506 08/28/22 0457  NA 131* 132* 134* 137      Mobility: Encourage ambulation  Goals of care   Code Status: Full Code     DVT prophylaxis:  enoxaparin (LOVENOX) injection 40 mg Start: 08/27/22 1000   Antimicrobials: IV Rocephin, azithromycin Fluid: None Consultants: None Family Communication: Significant other at bedside  Status: Inpatient Level of care:  Telemetry   Patient from: Home  Anticipated d/c to: Hopefully home in 1 to 2 days Needs to continue in-hospital care:  Unable to discharge because of persistent hypoxia.    Diet:  Diet Order             Diet regular Room service appropriate? Yes; Fluid consistency: Thin  Diet effective now                   Scheduled Meds:   amLODipine  5 mg Oral Daily   dextromethorphan-guaiFENesin  1 tablet Oral BID   enoxaparin (LOVENOX) injection  40 mg Subcutaneous Q24H   furosemide  20 mg Intravenous Once   insulin aspart  0-15 Units Subcutaneous TID WC   insulin aspart  0-5 Units Subcutaneous QHS   insulin glargine-yfgn  14 Units Subcutaneous Daily   metoprolol tartrate  50 mg Oral BID   predniSONE  20 mg Oral Q breakfast    PRN meds: acetaminophen, benzonatate, ipratropium-albuterol, ondansetron (ZOFRAN) IV, oxyCODONE   Infusions:   azithromycin 500 mg (08/28/22 0908)   cefTRIAXone (ROCEPHIN)  IV 2 g (08/28/22 0019)    Antimicrobials: Anti-infectives (From admission, onward)    Start     Dose/Rate Route Frequency Ordered Stop   08/28/22 0000  cefTRIAXone (ROCEPHIN) 2 g in sodium chloride 0.9 % 100 mL IVPB        2 g 200 mL/hr over 30 Minutes Intravenous Every 24 hours 08/27/22 0601 09/01/22 2359   08/27/22 1000  cefTRIAXone (ROCEPHIN) 2 g in sodium chloride 0.9 % 100 mL IVPB  Status:  Discontinued        2 g 200 mL/hr over 30 Minutes Intravenous Every 24 hours 08/27/22 0459 08/27/22 0601   08/27/22 1000  azithromycin (ZITHROMAX) 500 mg in sodium chloride 0.9 % 250 mL IVPB        500 mg 250 mL/hr over 60 Minutes Intravenous Every 24 hours 08/27/22 0459 09/01/22 0959   08/27/22 0230  cefTRIAXone (ROCEPHIN) 1 g in sodium chloride 0.9 % 100 mL IVPB        1 g 200 mL/hr over 30 Minutes Intravenous  Once 08/27/22 0216 08/27/22 0325   08/27/22 0230  azithromycin (ZITHROMAX) 500 mg in sodium chloride 0.9 % 250 mL IVPB        500 mg 250 mL/hr over 60 Minutes Intravenous  Once 08/27/22 0216 08/27/22 1226       Nutritional status:  Body mass index is 40.35 kg/m.          Objective: Vitals:   08/28/22 0921 08/28/22 1307  BP: (!) 135/108 (!) 141/99  Pulse: (!) 122 (!) 109  Resp: 20 20  Temp: 99.2 F (37.3 C) 98 F (36.7 C)  SpO2: 100% 100%    Intake/Output Summary (Last 24 hours) at 08/28/2022  1451 Last data filed at 08/28/2022 0900 Gross per 24 hour  Intake 610 ml  Output --  Net 610 ml   Filed Weights   08/26/22 2153  Weight: 113.4 kg   Weight change:  Body mass index is 40.35 kg/m.   Physical Exam: General exam: Pleasant, middle-aged African-American female.  Morbidly obese Skin: No rashes, lesions or ulcers. HEENT: Atraumatic, normocephalic, no obvious bleeding Lungs: Clear to auscultation bilaterally.  Coughs on deep breathing.  No wheezing CVS: Regular rate and rhythm, no murmur GI/Abd soft, nontender, nondistended, bowel sound present CNS: Alert, awake, oriented x 3 Psychiatry: Mood appropriate Extremities: No pedal edema, no calf redness  Data Review: I have personally reviewed the laboratory  data and studies available.  F/u labs ordered Unresulted Labs (From admission, onward)     Start     Ordered   09/03/22 0500  Creatinine, serum  (enoxaparin (LOVENOX)    CrCl >/= 30 ml/min)  Weekly,   R     Comments: while on enoxaparin therapy    08/27/22 0459   08/27/22 0544  Hemoglobin A1c  Once,   R       Comments: To assess prior glycemic control    08/27/22 0544   08/27/22 0458  Legionella Pneumophila Serogp 1 Ur Ag  Once,   R        08/27/22 0459            Total time spent in review of labs and imaging, patient evaluation, formulation of plan, documentation and communication with family: 45 minutes  Signed, Lorin Glass, MD [facility] 08/28/2022

## 2022-08-28 NOTE — Progress Notes (Signed)
Walking O2 test was done with pt on room air and while on supplemental O2.   Pts SpO2 dropped down to 77% but was able to recover after a few seconds of rest and deep breaths. HR rose from 108 to 124 while ambulating. Pt states she did not feel short of breath or tired when ambulating, including when her her SpO2 had dropped.  Pts SpO2 stayed above 97% while ambulating with 2L O2 via nasal cannula. Again, she had no C/O fatigue or shortness of breath. Her HR did increase from 113 to 123 BPM while ambulating, though she did recover after a few seconds of rest.

## 2022-08-28 NOTE — Discharge Summary (Signed)
Physician Discharge Summary  Andrea [name] ZOX:[id number] DOB: Nov 28, 1970 DOA: 08/26/2022  PCP: Jearld Lesch, MD  Admit date: 08/26/2022 Discharge date: 08/29/2022  Admitted From: Home Discharge disposition: Home  Recommendations at discharge:  Complete 5-day course of Omnicef with probiotics Complete 5-day course of prednisone Your metoprolol dose has been increased from 25 to 50 mg twice daily.  Amlodipine has been reduced to 5 mg daily.  Continue to monitor blood pressure at home.  Can increase the dose of amlodipine back to 10 mg if needed. Outpatient referral for sleep study has been sent.   Brief narrative: Andrea [name] is a 52 y.o. female with PMH significant for SLE, ILD, DM2, HTN  7/12, patient presented to the ED with complaint of dry fever, cough, shortness of breath, wheezing, hoarseness lightheadedness ongoing for a week.  Blood sugar levels was reading in the 300s for last several days  In the ED, patient had a fever of 101.6, heart rate 126, blood show 148/93, breathing on room air VBG with pH 7.45, pCO2 32 Labs with CBC unremarkable, sodium 131, glucose 391, troponin normal, BNP normal Respiratory virus panel unremarkable Chest x-ray with low lung volumes with mild to moderate severity pulmonary vascular congestion and mild bilateral suprahilar and bilateral infrahilar atelectatic changes. CT angio chest did not show evidence of pulm embolism but showed moderate severity chronic interstitial disease with superimposed components of scarring and/or atelectasis. In the ED, on ambulation, her saturation dropped to 71% Blood culture sent She was started on IV Rocephin, azithromycin Admitted to [facility]  Subjective: Patient was seen and examined this am. She has plan for discharge yesterday but on ambulation her oxygen saturation dropped down to 70s and she had significant dose.  Hence discharge was canceled. Feels better today.  Able to ambulate better  today.  Still needs supplemental oxygen on ambulation but overall symptoms are improving.  Tachycardia improving. Cough and hoarseness improving.  Hospital course: Sepsis POA Bilateral pneumonia Presented with worsening respiratory symptoms for a week Noted to have fever, tachycardia, hypoxia Chest x-ray findings was suspicious for bilateral pneumonia Blood cultures sent Started on IV Rocephin, IV azithromycin Respiratory status gradually improving.  Blood culture did not show any growth. Plan to discharge on 5 more days of oral Omnicef with probiotics. Also continue Mucinex, Tessalon Perles for cough  Acute respiratory failure with hypoxia H/o ILD  Suspect OSA In the ED, O2 sat dropped down to 71% on ambulation. CT angio chest did not show evidence of pulm embolism but showed moderate severity chronic interstitial disease with superimposed components of scarring and/or atelectasis. Started on supplemental oxygen.  Gradually weaned down.   Required 2 L oxygen by nasal cannula on ambulation today. Endorses symptoms of sleep apnea including snoring, apneic episodes and daytime fatigue  Ambulatory referral given for sleep studies.  Hoarseness Significant hoarseness likely due to vocal cord irritation from persistent cough.  Started on a course of prednisone oral.  Persistent tachycardia Has chronic persistent sinus tachycardia.  PTA on metoprolol 25 mg twice daily.  It has been increased to 50 mg daily.  Essential hypertension PTA meds-Lopressor 25 mg twice daily, Norvasc 10 mg daily Discharge on Lopressor 50 mg daily and Norvasc 5 mg daily.  Type 2 diabetes mellitus with hyperglycemia A1c 7.3 from 2013.  Repeat A1c sent. PTA meds-metformin 1000 mg twice daily and Lantus 14 units daily.  Continue the same. Recent Labs  Lab 08/28/22 0741 08/28/22 1155 08/28/22 1652 08/28/22 2105 08/29/22  0726  GLUCAP 143* 267* 208* 165* 155*   H/o SLE Not on meds  Hypokalemia Potassium  level low at 3.4 this morning.  Oral replacement given.   Recent Labs  Lab 08/26/22 2216 08/26/22 2242 08/27/22 0506 08/28/22 0457  K 3.8 3.7 3.4* 4.2  MG  --   --  1.8  --     Hyponatremia Sodium level mildly low.  Improving.  Continue to monitor Recent Labs  Lab 08/26/22 2216 08/26/22 2242 08/27/22 0506 08/28/22 0457  NA 131* 132* 134* 137   Mobility: Encourage ambulation  Goals of care   Code Status: Full Code   Wounds:  -    Discharge Exam:   Vitals:   08/28/22 1950 08/29/22 0452 08/29/22 0939 08/29/22 1253  BP: 124/77 116/74 130/89 (!) 129/91  Pulse: (!) 105 97 (!) 101 96  Resp: 18 18  16   Temp: 97.8 F (36.6 C) 97.9 F (36.6 C)  98.3 F (36.8 C)  TempSrc: Oral Oral  Oral  SpO2: 100% 98%  99%  Weight:      Height:        Body mass index is 40.35 kg/m.   General exam: Pleasant, middle-aged African-American female. Skin: No rashes, lesions or ulcers. HEENT: Atraumatic, normocephalic, no obvious bleeding Lungs: Clear to auscultation bilaterally.  Coughs on deep breathing. No wheezing CVS: Tachycardic, regular rhythm, no murmur GI/Abd soft, nontender, nondistended, bowel sound present CNS: Alert, awake, oriented x 3 Psychiatry: Mood appropriate Extremities: No pedal edema, no calf redness   Follow ups:    Follow-up Information     Jearld Lesch, MD Follow up.   Specialty: Family Medicine Contact information: [address] [place] [address] [phone]         Jearld Lesch, MD Follow up.   Specialty: Family Medicine Contact information: [address] [place] [address] [phone]                 Discharge Instructions:   Discharge Instructions     Ambulatory referral to Sleep Studies   Complete by: As directed    Call MD for:  difficulty breathing, headache or visual disturbances   Complete by: As directed    Call MD for:  extreme fatigue   Complete by: As directed    Call MD for:  hives   Complete by: As  directed    Call MD for:  persistant dizziness or light-headedness   Complete by: As directed    Call MD for:  persistant nausea and vomiting   Complete by: As directed    Call MD for:  severe uncontrolled pain   Complete by: As directed    Call MD for:  temperature >100.4   Complete by: As directed    Diet - low sodium heart healthy   Complete by: As directed    Discharge instructions   Complete by: As directed    Recommendations at discharge:   Complete 5-day course of Omnicef with probiotics  Complete 5-day course of prednisone  Your metoprolol dose has been increased from 25 to 50 mg twice daily.  Amlodipine has been reduced to 5 mg daily.  Continue to monitor blood pressure at home.  Can increase the dose of amlodipine back to 10 mg if needed.  Outpatient referral for sleep study has been sent.  General discharge instructions: Follow with Primary MD Jearld Lesch, MD in 7 days  Please request your PCP  to go over your hospital tests,  procedures, radiology results at the follow up. Please get your medicines reviewed and adjusted.  Your PCP may decide to repeat certain labs or tests as needed. Do not drive, operate heavy machinery, perform activities at heights, swimming or participation in water activities or provide baby sitting services if your were admitted for syncope or siezures until you have seen by Primary MD or a Neurologist and advised to do so again. [place] Controlled Substance Reporting System database was reviewed. Do not drive, operate heavy machinery, perform activities at heights, swim, participate in water activities or provide baby-sitting services while on medications for pain, sleep and mood until your outpatient physician has reevaluated you and advised to do so again.  You are strongly recommended to comply with the dose, frequency and duration of prescribed medications. Activity: As tolerated with Full fall precautions use walker/cane & assistance  as needed Avoid using any recreational substances like cigarette, tobacco, alcohol, or non-prescribed drug. If you experience worsening of your admission symptoms, develop shortness of breath, life threatening emergency, suicidal or homicidal thoughts you must seek medical attention immediately by calling 911 or calling your MD immediately  if symptoms less severe. You must read complete instructions/literature along with all the possible adverse reactions/side effects for all the medicines you take and that have been prescribed to you. Take any new medicine only after you have completely understood and accepted all the possible adverse reactions/side effects.  Wear Seat belts while driving. You were cared for by a hospitalist during your hospital stay. If you have any questions about your discharge medications or the care you received while you were in the hospital after you are discharged, you can call the unit and ask to speak with the hospitalist or the covering physician. Once you are discharged, your primary care physician will handle any further medical issues. Please note that NO REFILLS for any discharge medications will be authorized once you are discharged, as it is imperative that you return to your primary care physician (or establish a relationship with a primary care physician if you do not have one).   Increase activity slowly   Complete by: As directed        Discharge Medications:   Allergies as of 08/29/2022   No Known Allergies      Medication List     STOP taking these medications    Ozempic (0.25 or 0.5 MG/DOSE) 2 MG/3ML Sopn Generic drug: Semaglutide(0.25 or 0.5MG /DOS)       TAKE these medications    Accu-Chek Aviva Plus w/Device Kit Use as directed 3 times a day   Accu-Chek [employer] Kit Use as directed 3 times a day   albuterol 108 (90 Base) MCG/ACT inhaler Commonly known as: VENTOLIN HFA Inhale 1 - 2 puffs by mouth every 4 hours as needed    amLODipine 5 MG tablet Commonly known as: NORVASC Take 1 tablet (5 mg total) by mouth daily. What changed:  medication strength how much to take Another medication with the same name was removed. Continue taking this medication, and follow the directions you see here.   cefdinir 300 MG capsule Commonly known as: OMNICEF Take 1 capsule (300 mg total) by mouth 2 (two) times daily for 5 days.   dextromethorphan-guaiFENesin 30-600 MG 12hr tablet Commonly known as: MUCINEX DM Take 1 tablet by mouth 2 (two) times daily for 7 days.   metformin 1000 MG (OSM) 24 hr tablet Commonly known as: FORTAMET Take 1 tablet (1,000 mg total) by  mouth daily with breakfast.   metoprolol tartrate 50 MG tablet Commonly known as: LOPRESSOR Take 1 tablet (50 mg total) by mouth 2 (two) times daily. What changed:  medication strength how much to take how to take this when to take this Another medication with the same name was removed. Continue taking this medication, and follow the directions you see here.   naloxone 4 MG/0.1ML Liqd nasal spray kit Commonly known as: NARCAN Inhale 1 spray (4 mg) into nostril one time as directed   oxyCODONE 15 MG immediate release tablet Commonly known as: ROXICODONE Take 1 tablet (15 mg) by mouth every 6 hours as needed for pain What changed: Another medication with the same name was removed. Continue taking this medication, and follow the directions you see here.   predniSONE 20 MG tablet Commonly known as: DELTASONE Take 1 tablet (20 mg total) by mouth daily with breakfast for 5 days.   saccharomyces boulardii 250 MG capsule Commonly known as: FLORASTOR Take 1 capsule (250 mg total) by mouth 2 (two) times daily for 5 days.               Durable Medical Equipment  (From admission, onward)           Start     Ordered   08/29/22 1307  For home use only DME oxygen  Once       Question Answer Comment  Length of Need Lifetime   Mode or (Route)  Nasal cannula   Liters per Minute 2   Oxygen conserving device Yes   Oxygen delivery system Gas      08/29/22 1306             The results of significant diagnostics from this hospitalization (including imaging, microbiology, ancillary and laboratory) are listed below for reference.    Procedures and Diagnostic Studies:   CT Angio Chest PE W and/or Wo Contrast  Result Date: 08/27/2022 CLINICAL DATA:  Cough and fever. EXAM: CT ANGIOGRAPHY CHEST WITH CONTRAST TECHNIQUE: Multidetector CT imaging of the chest was performed using the standard protocol during bolus administration of intravenous contrast. Multiplanar CT image reconstructions and MIPs were obtained to evaluate the vascular anatomy. RADIATION DOSE REDUCTION: This exam was performed according to the departmental dose-optimization program which includes automated exposure control, adjustment of the mA and/or kV according to patient size and/or use of iterative reconstruction technique. CONTRAST:  OMNIPAQUE IOHEXOL 350 MG/ML SOLN COMPARISON:  December 18, 2011 FINDINGS: Cardiovascular: Satisfactory opacification of the pulmonary arteries to the segmental level. No evidence of pulmonary embolism. Normal heart size with mild coronary artery calcification. No pericardial effusion. Mediastinum/Nodes: No enlarged mediastinal, hilar, or axillary lymph nodes. Thyroid gland, trachea, and esophagus demonstrate no significant findings. Lungs/Pleura: Predominantly stable moderate severity chronic interstitial disease is again seen along the anteromedial aspects of the bilateral upper lobes and bilateral lung bases. Superimposed components of scarring and/or atelectasis are suspected. No pleural effusion or pneumothorax is identified. Upper Abdomen: There is diffuse fatty infiltration of the liver parenchyma. Musculoskeletal: No chest wall abnormality. No acute or significant osseous findings. Review of the MIP images confirms the above findings.  IMPRESSION: 1. No evidence of pulmonary embolism. 2. Moderate severity chronic interstitial disease with superimposed components of scarring and/or atelectasis. 3. Fatty liver. Electronically Signed   By: Aram Candela M.D.   On: 08/27/2022 00:39   DG Chest 2 View  Result Date: 08/26/2022 CLINICAL DATA:  Fever and cough. EXAM: CHEST - 2 VIEW COMPARISON:  February 24, 2020 FINDINGS: The heart size and mediastinal contours are within normal limits. Low lung volumes are noted. There is mild to moderate severity prominence of the perihilar pulmonary vasculature. Mild bilateral suprahilar and bilateral infrahilar atelectatic changes are seen, slightly more prominent along the left lung base. No pleural effusion or pneumothorax is noted. The visualized skeletal structures are unremarkable. IMPRESSION: 1. Low lung volumes with mild to moderate severity pulmonary vascular congestion. 2. Mild bilateral suprahilar and bilateral infrahilar atelectatic changes. Electronically Signed   By: Aram Candela M.D.   On: 08/26/2022 22:23     Labs:   Basic Metabolic Panel: Recent Labs  Lab 08/26/22 2216 08/26/22 2242 08/27/22 0506 08/28/22 0457  NA 131* 132* 134* 137  K 3.8 3.7 3.4* 4.2  CL 98  --  104 104  CO2 21*  --  19* 24  GLUCOSE 391*  --  248* 194*  BUN 12  --  14 13  CREATININE 0.84  --  0.80 0.65  CALCIUM 9.0  --  8.4* 9.0  MG  --   --  1.8  --    GFR Estimated Creatinine Clearance: 102.7 mL/min (by C-G formula based on SCr of 0.65 mg/dL). Liver Function Tests: No results for input(s): "AST", "ALT", "ALKPHOS", "BILITOT", "PROT", "ALBUMIN" in the last 168 hours. No results for input(s): "LIPASE", "AMYLASE" in the last 168 hours. No results for input(s): "AMMONIA" in the last 168 hours. Coagulation profile No results for input(s): "INR", "PROTIME" in the last 168 hours.  CBC: Recent Labs  Lab 08/26/22 2216 08/26/22 2242 08/27/22 0506 08/28/22 0457  WBC 9.9  --  7.8 8.2  NEUTROABS  5.2  --  3.8 4.4  HGB 12.0 12.9 11.8* 11.0*  HCT 37.0 38.0 37.8 36.4  MCV 82.8  --  83.4 87.7  PLT 186  --  118* 143*   Cardiac Enzymes: No results for input(s): "CKTOTAL", "CKMB", "CKMBINDEX", "TROPONINI" in the last 168 hours. BNP: Invalid input(s): "POCBNP" CBG: Recent Labs  Lab 08/28/22 0741 08/28/22 1155 08/28/22 1652 08/28/22 2105 08/29/22 0726  GLUCAP 143* 267* 208* 165* 155*   D-Dimer No results for input(s): "DDIMER" in the last 72 hours. Hgb A1c Recent Labs    08/27/22 0610  HGBA1C 10.0*   Lipid Profile No results for input(s): "CHOL", "HDL", "LDLCALC", "TRIG", "CHOLHDL", "LDLDIRECT" in the last 72 hours. Thyroid function studies No results for input(s): "TSH", "T4TOTAL", "T3FREE", "THYROIDAB" in the last 72 hours.  Invalid input(s): "FREET3" Anemia work up No results for input(s): "VITAMINB12", "FOLATE", "FERRITIN", "TIBC", "IRON", "RETICCTPCT" in the last 72 hours. Microbiology Recent Results (from the past 240 hour(s))  Resp panel by RT-PCR (RSV, Flu A&B, Covid) Anterior Nasal Swab     Status: None   Collection Time: 08/26/22  9:58 PM   Specimen: Anterior Nasal Swab  Result Value Ref Range Status   SARS Coronavirus 2 by RT PCR NEGATIVE NEGATIVE Final    Comment: (NOTE) SARS-CoV-2 target nucleic acids are NOT DETECTED.  The SARS-CoV-2 RNA is generally detectable in upper respiratory specimens during the acute phase of infection. The lowest concentration of SARS-CoV-2 viral copies this assay can detect is 138 copies/mL. A negative result does not preclude SARS-Cov-2 infection and should not be used as the sole basis for treatment or other patient management decisions. A negative result may occur with  improper specimen collection/handling, submission of specimen other than nasopharyngeal swab, presence of viral mutation(s) within the areas targeted by this assay, and inadequate number  of viral copies(<138 copies/mL). A negative result must be combined  with clinical observations, patient history, and epidemiological information. The expected result is Negative.  Fact Sheet for Patients:  BloggerCourse.com  Fact Sheet for Healthcare Providers:  SeriousBroker.it  This test is no t yet approved or cleared by the [place] FDA and  has been authorized for detection and/or diagnosis of SARS-CoV-2 by FDA under an Emergency Use Authorization (EUA). This EUA will remain  in effect (meaning this test can be used) for the duration of the COVID-19 declaration under Section 564(b)(1) of the Act, 21 U.S.C.section 360bbb-3(b)(1), unless the authorization is terminated  or revoked sooner.       Influenza A by PCR NEGATIVE NEGATIVE Final   Influenza B by PCR NEGATIVE NEGATIVE Final    Comment: (NOTE) The Xpert Xpress SARS-CoV-2/FLU/RSV plus assay is intended as an aid in the diagnosis of influenza from Nasopharyngeal swab specimens and should not be used as a sole basis for treatment. Nasal washings and aspirates are unacceptable for Xpert Xpress SARS-CoV-2/FLU/RSV testing.  Fact Sheet for Patients: BloggerCourse.com  Fact Sheet for Healthcare Providers: SeriousBroker.it  This test is not yet approved or cleared by the [place] FDA and has been authorized for detection and/or diagnosis of SARS-CoV-2 by FDA under an Emergency Use Authorization (EUA). This EUA will remain in effect (meaning this test can be used) for the duration of the COVID-19 declaration under Section 564(b)(1) of the Act, 21 U.S.C. section 360bbb-3(b)(1), unless the authorization is terminated or revoked.     Resp Syncytial Virus by PCR NEGATIVE NEGATIVE Final    Comment: (NOTE) Fact Sheet for Patients: BloggerCourse.com  Fact Sheet for Healthcare Providers: SeriousBroker.it  This test is not yet approved  or cleared by the [place] FDA and has been authorized for detection and/or diagnosis of SARS-CoV-2 by FDA under an Emergency Use Authorization (EUA). This EUA will remain in effect (meaning this test can be used) for the duration of the COVID-19 declaration under Section 564(b)(1) of the Act, 21 U.S.C. section 360bbb-3(b)(1), unless the authorization is terminated or revoked.  Performed at [facility], [address] [place], [place] [address]   Group A Strep by PCR     Status: None   Collection Time: 08/26/22 10:36 PM   Specimen: Throat; Sterile Swab  Result Value Ref Range Status   Group A Strep by PCR NOT DETECTED NOT DETECTED Final    Comment: Performed at [facility], [address] [place], [place] [address]  Blood culture (routine x 2)     Status: None (Preliminary result)   Collection Time: 08/27/22  2:41 AM   Specimen: BLOOD  Result Value Ref Range Status   Specimen Description   Final    BLOOD BLOOD RIGHT ARM Performed at [facility], [address], [place], [place] [address]    Special Requests   Final    BOTTLES DRAWN AEROBIC AND ANAEROBIC Blood Culture adequate volume Performed at [facility], [address] [place], [place] [address]    Culture   Final    NO GROWTH 2 DAYS Performed at [facility], [address], [place], [place] [address]    Report Status PENDING  Incomplete  Blood culture (routine x 2)     Status: None (Preliminary result)   Collection Time: 08/27/22  2:43 AM   Specimen: BLOOD  Result Value Ref Range Status   Specimen Description   Final    BLOOD  BLOOD LEFT ARM Performed at [facility], [address] [place], [place] [address]    Special Requests   Final    BOTTLES DRAWN AEROBIC AND ANAEROBIC Blood Culture adequate volume Performed at [facility], [address] [place], [place] [address]     Culture   Final    NO GROWTH 2 DAYS Performed at [facility], [address] [place], [place] [address]    Report Status PENDING  Incomplete  Respiratory (~20 pathogens) panel by PCR     Status: None   Collection Time: 08/27/22  6:00 AM   Specimen: Nasopharyngeal Swab; Respiratory  Result Value Ref Range Status   Adenovirus NOT DETECTED NOT DETECTED Final   Coronavirus 229E NOT DETECTED NOT DETECTED Final    Comment: (NOTE) The Coronavirus on the Respiratory Panel, DOES NOT test for the novel  Coronavirus (2019 nCoV)    Coronavirus HKU1 NOT DETECTED NOT DETECTED Final   Coronavirus NL63 NOT DETECTED NOT DETECTED Final   Coronavirus OC43 NOT DETECTED NOT DETECTED Final   Metapneumovirus NOT DETECTED NOT DETECTED Final   Rhinovirus / Enterovirus NOT DETECTED NOT DETECTED Final   Influenza A NOT DETECTED NOT DETECTED Final   Influenza B NOT DETECTED NOT DETECTED Final   Parainfluenza Virus 1 NOT DETECTED NOT DETECTED Final   Parainfluenza Virus 2 NOT DETECTED NOT DETECTED Final   Parainfluenza Virus 3 NOT DETECTED NOT DETECTED Final   Parainfluenza Virus 4 NOT DETECTED NOT DETECTED Final   Respiratory Syncytial Virus NOT DETECTED NOT DETECTED Final   Bordetella pertussis NOT DETECTED NOT DETECTED Final   Bordetella Parapertussis NOT DETECTED NOT DETECTED Final   Chlamydophila pneumoniae NOT DETECTED NOT DETECTED Final   Mycoplasma pneumoniae NOT DETECTED NOT DETECTED Final    Comment: Performed at [facility], [address] [place], [place] [address]    Time coordinating discharge: 45 minutes  Signed: Mane Consolo  [facility] 08/29/2022, 1:11 PM

## 2022-08-28 NOTE — Plan of Care (Signed)
  Problem: Education: Goal: Knowledge of General Education information will improve Description: Including pain rating scale, medication(s)/side effects and non-pharmacologic comfort measures Outcome: Progressing   Problem: Health Behavior/Discharge Planning: Goal: Ability to manage health-related needs will improve Outcome: Progressing   Problem: Nutrition: Goal: Adequate nutrition will be maintained Outcome: Progressing   Problem: Pain Managment: Goal: General experience of comfort will improve Outcome: Progressing   Problem: Health Behavior/Discharge Planning: Goal: Ability to identify and utilize available resources and services will improve Outcome: Progressing Goal: Ability to manage health-related needs will improve Outcome: Progressing   Problem: Clinical Measurements: Goal: Respiratory complications will improve Outcome: Not Progressing Note: Pt desats when ambulating on room air.    Problem: Activity: Goal: Risk for activity intolerance will decrease Outcome: Not Progressing

## 2022-08-29 ENCOUNTER — Other Ambulatory Visit (HOSPITAL_COMMUNITY): Payer: 59

## 2022-08-29 ENCOUNTER — Other Ambulatory Visit (HOSPITAL_COMMUNITY): Payer: Self-pay

## 2022-08-29 DIAGNOSIS — G4739 Other sleep apnea: Secondary | ICD-10-CM | POA: Insufficient documentation

## 2022-08-29 LAB — HEMOGLOBIN A1C
Hgb A1c MFr Bld: 10 % — ABNORMAL HIGH (ref 4.8–5.6)
Mean Plasma Glucose: 240 mg/dL

## 2022-08-29 LAB — GLUCOSE, CAPILLARY
Glucose-Capillary: 244 mg/dL — ABNORMAL HIGH (ref 70–99)
Glucose-Capillary: 280 mg/dL — ABNORMAL HIGH (ref 70–99)
Glucose-Capillary: 186 mg/dL — ABNORMAL HIGH (ref 70–99)
Glucose-Capillary: 208 mg/dL — ABNORMAL HIGH (ref 70–99)
Glucose-Capillary: 165 mg/dL — ABNORMAL HIGH (ref 70–99)
Glucose-Capillary: 155 mg/dL — ABNORMAL HIGH (ref 70–99)
Glucose-Capillary: 201 mg/dL — ABNORMAL HIGH (ref 70–99)

## 2022-08-29 LAB — LEGIONELLA PNEUMOPHILA SEROGP 1 UR AG: L. pneumophila Serogp 1 Ur Ag: NEGATIVE

## 2022-08-29 MED ORDER — METOPROLOL TARTRATE 50 MG PO TABS
50.0000 mg | ORAL_TABLET | Freq: Two times a day (BID) | ORAL | 0 refills | Status: AC
  Filled 2022-08-29: qty 100, 50d supply, fill #0

## 2022-08-29 MED ORDER — PREDNISONE 20 MG PO TABS
20.0000 mg | ORAL_TABLET | Freq: Every day | ORAL | 0 refills | Status: AC
  Filled 2022-08-29: qty 5, 5d supply, fill #0

## 2022-08-29 MED ORDER — DM-GUAIFENESIN ER 30-600 MG PO TB12
1.0000 | ORAL_TABLET | Freq: Two times a day (BID) | ORAL | 0 refills | Status: AC
  Filled 2022-08-29: qty 14, 7d supply, fill #0

## 2022-08-29 MED ORDER — AMLODIPINE BESYLATE 5 MG PO TABS
5.0000 mg | ORAL_TABLET | Freq: Every day | ORAL | 0 refills | Status: AC
  Filled 2022-08-29: qty 30, 30d supply, fill #0

## 2022-08-29 MED ORDER — CEFDINIR 300 MG PO CAPS
300.0000 mg | ORAL_CAPSULE | Freq: Two times a day (BID) | ORAL | 0 refills | Status: AC
  Filled 2022-08-29: qty 10, 5d supply, fill #0

## 2022-08-29 MED ORDER — SACCHAROMYCES BOULARDII 250 MG PO CAPS
250.0000 mg | ORAL_CAPSULE | Freq: Two times a day (BID) | ORAL | 0 refills | Status: AC
  Filled 2022-08-29: qty 20, 10d supply, fill #0

## 2022-08-29 MED ORDER — HYDROCOD POLI-CHLORPHE POLI ER 10-8 MG/5ML PO SUER
5.0000 mL | Freq: Once | ORAL | Status: AC
  Administered 2022-08-29: 5 mL via ORAL
  Filled 2022-08-29: qty 5

## 2022-08-29 NOTE — Progress Notes (Signed)
Patient provided with discharge education, patient verbalized understanding. IV removed.  

## 2022-08-29 NOTE — Progress Notes (Signed)
Walking O2 test completed with pt.  Pt was able to walk about 65-75 feet on room air before her SpO2 dropped from 92% to 88%. HR stayed steady at around 105 bpm at this time.Pt has no C/O dyspnea, cough or shortness of breath at this time.   After her O2 dropped, 2L O2 via nasal cannula was applied and pts O2 went up to 96%. Pt was able to ambulate the entirety of the unit on 2L with her O2 never dropping below 91% and her HR ranging between 105-109 bpm. Pt continue to remain free from dyspnea, cough or C/O shortness of breath.

## 2022-08-29 NOTE — Plan of Care (Signed)
  Problem: Education: Goal: Knowledge of General Education information will improve Description: Including pain rating scale, medication(s)/side effects and non-pharmacologic comfort measures 08/29/2022 1420 by Maddoxx Burkitt, Stormy Card, RN Outcome: Adequate for Discharge 08/29/2022 1211 by Derrius Furtick, Stormy Card, RN Outcome: Progressing   Problem: Health Behavior/Discharge Planning: Goal: Ability to manage health-related needs will improve 08/29/2022 1420 by Tyronica Truxillo, Stormy Card, RN Outcome: Adequate for Discharge 08/29/2022 1211 by Tymel Conely, Stormy Card, RN Outcome: Progressing   Problem: Clinical Measurements: Goal: Ability to maintain clinical measurements within normal limits will improve Outcome: Adequate for Discharge Goal: Will remain free from infection Outcome: Adequate for Discharge Goal: Diagnostic test results will improve Outcome: Adequate for Discharge Goal: Respiratory complications will improve Outcome: Adequate for Discharge Goal: Cardiovascular complication will be avoided Outcome: Adequate for Discharge   Problem: Activity: Goal: Risk for activity intolerance will decrease Outcome: Adequate for Discharge   Problem: Nutrition: Goal: Adequate nutrition will be maintained Outcome: Adequate for Discharge   Problem: Coping: Goal: Level of anxiety will decrease Outcome: Adequate for Discharge   Problem: Elimination: Goal: Will not experience complications related to bowel motility Outcome: Adequate for Discharge Goal: Will not experience complications related to urinary retention Outcome: Adequate for Discharge   Problem: Pain Managment: Goal: General experience of comfort will improve Outcome: Adequate for Discharge   Problem: Safety: Goal: Ability to remain free from injury will improve Outcome: Adequate for Discharge   Problem: Skin Integrity: Goal: Risk for impaired skin integrity will decrease Outcome: Adequate for Discharge   Problem: Education: Goal:  Ability to describe self-care measures that may prevent or decrease complications (Diabetes Survival Skills Education) will improve Outcome: Adequate for Discharge Goal: Individualized Educational Video(s) Outcome: Adequate for Discharge   Problem: Coping: Goal: Ability to adjust to condition or change in health will improve Outcome: Adequate for Discharge   Problem: Fluid Volume: Goal: Ability to maintain a balanced intake and output will improve Outcome: Adequate for Discharge   Problem: Health Behavior/Discharge Planning: Goal: Ability to identify and utilize available resources and services will improve Outcome: Adequate for Discharge Goal: Ability to manage health-related needs will improve Outcome: Adequate for Discharge   Problem: Metabolic: Goal: Ability to maintain appropriate glucose levels will improve Outcome: Adequate for Discharge   Problem: Nutritional: Goal: Maintenance of adequate nutrition will improve Outcome: Adequate for Discharge Goal: Progress toward achieving an optimal weight will improve Outcome: Adequate for Discharge   Problem: Skin Integrity: Goal: Risk for impaired skin integrity will decrease Outcome: Adequate for Discharge   Problem: Tissue Perfusion: Goal: Adequacy of tissue perfusion will improve Outcome: Adequate for Discharge

## 2022-08-29 NOTE — TOC Transition Note (Signed)
Transition of Care [facility]) - CM/SW Discharge Note   Patient Details  Name: Andrea [name] MRN: [id number] Date of Birth: [birth year]  [facility] CM/SW Contact:  Otelia Santee, LCSW Phone Number: 08/29/2022, 2:26 PM   Clinical Narrative:    Met with pt and confirmed need for home O2. Pt currently does not have insurance and is unable to afford private paying for O2.  LOG has been completed to cover pt's O2 through [employer].  Contacted Mitch w/ [employer] who has ordered pt's O2 and will have it delivered to room prior to pt discharging.    Final next level of care: Home/Self Care Barriers to Discharge: No Barriers Identified   Patient Goals and CMS Choice CMS Medicare.gov Compare Post Acute Care list provided to:: Patient Choice offered to / list presented to : Patient  Discharge Placement                         Discharge Plan and Services Additional resources added to the After Visit Summary for                  DME Arranged: Oxygen DME Agency: [employer] Date DME Agency Contacted: 08/29/22 Time DME Agency Contacted: 1426 Representative spoke with at DME Agency: Marthann Schiller            Social Determinants of Health (SDOH) Interventions SDOH Screenings   Food Insecurity: No Food Insecurity (08/27/2022)  Housing: Patient Declined (08/27/2022)  Transportation Needs: No Transportation Needs (08/27/2022)  Utilities: Not At Risk (08/27/2022)  Social Connections: Unknown (06/29/2021)   Received from [facility]  Tobacco Use: Low Risk  (08/26/2022)     Readmission Risk Interventions    08/29/2022    9:03 AM  Readmission Risk Prevention Plan  Post Dischage Appt Complete  Medication Screening Complete  Transportation Screening Complete

## 2022-08-29 NOTE — Plan of Care (Signed)

## 2022-09-01 LAB — CULTURE, BLOOD (ROUTINE X 2)
Culture: NO GROWTH
Culture: NO GROWTH
Special Requests: ADEQUATE
Special Requests: ADEQUATE

## 2022-09-13 ENCOUNTER — Other Ambulatory Visit (HOSPITAL_COMMUNITY): Payer: Self-pay

## 2022-09-13 MED ORDER — OXYCODONE HCL 15 MG PO TABS
15.0000 mg | ORAL_TABLET | Freq: Four times a day (QID) | ORAL | 0 refills | Status: DC
  Filled 2022-09-16: qty 120, 30d supply, fill #0

## 2022-09-13 MED ORDER — ALBUTEROL SULFATE HFA 108 (90 BASE) MCG/ACT IN AERS
1.0000 | INHALATION_SPRAY | RESPIRATORY_TRACT | 3 refills | Status: AC | PRN
  Filled 2022-09-13: qty 6.7, 33d supply, fill #0
  Filled 2023-01-26: qty 6.7, 33d supply, fill #1
  Filled 2023-02-16: qty 6.7, 33d supply, fill #2

## 2022-09-13 MED ORDER — BENZONATATE 100 MG PO CAPS
100.0000 mg | ORAL_CAPSULE | Freq: Three times a day (TID) | ORAL | 0 refills | Status: AC | PRN
  Filled 2022-09-13: qty 60, 20d supply, fill #0

## 2022-09-14 ENCOUNTER — Encounter: Payer: Self-pay | Admitting: Podiatry

## 2022-09-14 ENCOUNTER — Ambulatory Visit (INDEPENDENT_AMBULATORY_CARE_PROVIDER_SITE_OTHER): Payer: Self-pay | Admitting: Podiatry

## 2022-09-14 DIAGNOSIS — E1321 Other specified diabetes mellitus with diabetic nephropathy: Secondary | ICD-10-CM

## 2022-09-14 DIAGNOSIS — E119 Type 2 diabetes mellitus without complications: Secondary | ICD-10-CM

## 2022-09-14 NOTE — Patient Instructions (Signed)

## 2022-09-16 ENCOUNTER — Other Ambulatory Visit: Payer: Self-pay

## 2022-09-16 ENCOUNTER — Other Ambulatory Visit (HOSPITAL_COMMUNITY): Payer: Self-pay

## 2022-09-18 NOTE — Progress Notes (Signed)
ANNUAL DIABETIC FOOT EXAM  Subjective: Andrea [name] presents today annual diabetic foot exam.  Patient confirms h/o diabetes.  Patient denies any h/o foot wounds.  Patient denies any numbness, tingling, burning, or pins/needle sensation in feet.  Patient has been diagnosed with neuropathy.  Risk factors: diabetes, HTN.  Jearld Lesch, MD is patient's PCP.  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 6.7 g 3   albuterol (VENTOLIN HFA) 108 (90 Base) MCG/ACT inhaler Inhale 1 - 2 puffs by mouth every 4 hours as needed 9 g 11   amLODipine (NORVASC) 5 MG tablet Take 1 tablet (5 mg total) by mouth daily. 30 tablet 0   benzonatate (TESSALON) 100 MG capsule Take 1 capsule (100 mg total) by mouth 3 (three) times daily as needed. 60 capsule 0   Blood Glucose Monitoring Suppl (ACCU-CHEK  AVIVA PLUS) w/Device KIT Use as directed 3 times a day 1 kit 0   Lancets Misc. (ACCU-CHEK FASTCLIX LANCET) KIT Use as directed 3 times a day 50 kit 6   metFORMIN (FORTAMET) 1000 MG (OSM) 24 hr tablet Take 1 tablet (1,000 mg total) by mouth daily with breakfast. 30 tablet 0   metoprolol tartrate (LOPRESSOR) 50 MG tablet Take 1 tablet (50 mg total) by mouth 2 (two) times daily. 100 tablet 0   naloxone (NARCAN) nasal spray 4 mg/0.1 mL Inhale 1 spray (4 mg) into nostril one time as directed 2 each 0   oxyCODONE (ROXICODONE) 15 MG immediate release tablet Take 1 tablet (15 mg) by mouth every 6 hours as needed for pain 120 tablet 0   oxyCODONE (ROXICODONE) 15 MG immediate release tablet Take 1 tablet (15 mg total) by mouth every 6 (six) hours (fill 09/16/22) 120 tablet 0   No current facility-administered medications on file prior to visit.    No Known Allergies Social History   Occupational History   Occupation: call center    Employer: [employer]  Tobacco Use   Smoking status: Never   Smokeless tobacco: Never  Vaping Use   Vaping status: Never Used  Substance and Sexual Activity   Alcohol use: Yes    Alcohol/week: 2.0 standard drinks of alcohol    Types: 2 Glasses of wine per week    Comment: occ.   Drug use: No   Sexual activity: Yes    Birth control/protection: Post-menopausal   Family History  Problem Relation Age of Onset   Lupus Mother        deceased   Heart attack Father        deceased   Breast cancer Neg Hx    Immunization History  Administered Date(s) Administered   Influenza Split 12/16/2011   PFIZER(Purple Top)SARS-COV-2 Vaccination 04/29/2019, 05/20/2019   Pneumococcal Polysaccharide-23 12/16/2011     Review of Systems: Negative except as noted in the HPI.   Objective: There were no vitals filed for this visit.  Andrea [name] is a pleasant 52 y.o. female in NAD. AAO X 3.  Vascular Examination: Capillary refill time immediate b/l. Vascular status intact  b/l with palpable pedal pulses. Pedal hair present b/l. No pain with calf compression b/l. Skin temperature gradient WNL b/l. No cyanosis or clubbing b/l. No ischemia or gangrene noted b/l.   Neurological Examination: Sensation grossly intact b/l with 10 gram monofilament. Vibratory sensation intact b/l.   Dermatological Examination: Pedal skin with normal turgor, texture and tone b/l.  No open wounds. No interdigital macerations.   Toenails 1-5 b/l well maintained with adequate length. No erythema, no edema, no drainage, no fluctuance.  Musculoskeletal Examination: Normal muscle strength 5/5 to all lower extremity muscle groups bilaterally. No pain, crepitus or joint limitation noted with ROM b/l LE. No gross bony pedal deformities b/l. Patient ambulates independently without assistive aids.  Radiographs: None  Last A1c:      Latest Ref Rng & Units 08/27/2022    6:10 AM  Hemoglobin A1C  Hemoglobin-A1c 4.8 - 5.6 % 10.0    ADA Risk Categorization: Low Risk :  Patient has all of the following: Intact protective sensation No prior foot ulcer  No severe deformity Pedal pulses present  Assessment: 1. Controlled type 2 diabetes mellitus without complication, without long-term current use of insulin (HCC)   2. Encounter for diabetic foot exam (HCC)     Plan: -Consent given for treatment as described below: -Examined patient. -Diabetic foot examination performed today. -Continue diabetic foot care principles: inspect feet daily, monitor glucose as recommended by PCP and/or Endocrinologist, and follow prescribed diet per PCP, Endocrinologist and/or dietician. -Patient/POA to call should there be question/concern in the interim. Return in about 1 year (around 09/14/2023).  Freddie Breech, DPM

## 2022-09-22 ENCOUNTER — Other Ambulatory Visit (HOSPITAL_COMMUNITY): Payer: Self-pay

## 2022-09-22 MED ORDER — PREDNISONE 5 MG PO TABS
5.0000 mg | ORAL_TABLET | Freq: Every day | ORAL | 5 refills | Status: AC
  Filled 2022-09-22: qty 30, 30d supply, fill #0

## 2022-09-26 ENCOUNTER — Other Ambulatory Visit (HOSPITAL_COMMUNITY): Payer: Self-pay

## 2022-09-30 ENCOUNTER — Other Ambulatory Visit (HOSPITAL_COMMUNITY): Payer: Self-pay

## 2022-10-05 ENCOUNTER — Other Ambulatory Visit: Payer: Self-pay

## 2022-10-05 ENCOUNTER — Other Ambulatory Visit (HOSPITAL_COMMUNITY): Payer: Self-pay

## 2022-10-10 ENCOUNTER — Other Ambulatory Visit (HOSPITAL_COMMUNITY): Payer: Self-pay

## 2022-10-18 ENCOUNTER — Other Ambulatory Visit (HOSPITAL_COMMUNITY): Payer: Self-pay

## 2022-10-18 MED ORDER — AMLODIPINE BESYLATE 10 MG PO TABS
10.0000 mg | ORAL_TABLET | Freq: Every day | ORAL | 5 refills | Status: AC
  Filled 2022-10-18: qty 30, 30d supply, fill #0

## 2022-10-18 MED ORDER — METFORMIN HCL 1000 MG PO TABS
500.0000 mg | ORAL_TABLET | Freq: Two times a day (BID) | ORAL | 6 refills | Status: AC
  Filled 2022-10-18: qty 30, 30d supply, fill #0

## 2022-10-18 MED ORDER — METOPROLOL TARTRATE 50 MG PO TABS
100.0000 mg | ORAL_TABLET | Freq: Two times a day (BID) | ORAL | 5 refills | Status: AC
  Filled 2022-10-18: qty 60, 15d supply, fill #0

## 2022-10-18 MED ORDER — OXYCODONE HCL 15 MG PO TABS
15.0000 mg | ORAL_TABLET | Freq: Four times a day (QID) | ORAL | 0 refills | Status: DC
  Filled 2022-10-18: qty 120, 30d supply, fill #0

## 2022-10-24 ENCOUNTER — Other Ambulatory Visit (HOSPITAL_COMMUNITY): Payer: Self-pay

## 2022-11-08 ENCOUNTER — Institutional Professional Consult (permissible substitution) (HOSPITAL_BASED_OUTPATIENT_CLINIC_OR_DEPARTMENT_OTHER): Payer: No Typology Code available for payment source | Admitting: Pulmonary Disease

## 2022-11-15 ENCOUNTER — Other Ambulatory Visit (HOSPITAL_COMMUNITY): Payer: Self-pay

## 2022-11-15 MED ORDER — VALSARTAN-HYDROCHLOROTHIAZIDE 320-25 MG PO TABS
1.0000 | ORAL_TABLET | Freq: Every day | ORAL | 5 refills | Status: AC
  Filled 2022-11-15: qty 30, 30d supply, fill #0

## 2022-11-15 MED ORDER — OXYCODONE HCL 15 MG PO TABS
15.0000 mg | ORAL_TABLET | Freq: Four times a day (QID) | ORAL | 0 refills | Status: DC
Start: 2022-11-15 — End: 2022-12-13
  Filled 2022-11-15: qty 120, 30d supply, fill #0

## 2022-12-13 ENCOUNTER — Other Ambulatory Visit (HOSPITAL_COMMUNITY): Payer: Self-pay

## 2022-12-13 ENCOUNTER — Other Ambulatory Visit: Payer: Self-pay

## 2022-12-13 MED ORDER — AZITHROMYCIN 250 MG PO TABS
ORAL_TABLET | ORAL | 0 refills | Status: AC
  Filled 2022-12-13: qty 6, 5d supply, fill #0

## 2022-12-13 MED ORDER — OXYCODONE HCL 15 MG PO TABS
15.0000 mg | ORAL_TABLET | Freq: Four times a day (QID) | ORAL | 0 refills | Status: DC
Start: 2022-12-13 — End: 2023-01-10
  Filled 2022-12-13: qty 120, 30d supply, fill #0

## 2022-12-13 MED ORDER — PROMETHAZINE-DM 6.25-15 MG/5ML PO SYRP
5.0000 mL | ORAL_SOLUTION | ORAL | 0 refills | Status: AC | PRN
  Filled 2022-12-13: qty 473, 16d supply, fill #0

## 2023-01-10 ENCOUNTER — Other Ambulatory Visit: Payer: Self-pay

## 2023-01-10 ENCOUNTER — Other Ambulatory Visit (HOSPITAL_COMMUNITY): Payer: Self-pay

## 2023-01-10 MED ORDER — PROMETHAZINE-DM 6.25-15 MG/5ML PO SYRP
ORAL_SOLUTION | ORAL | 0 refills | Status: AC
  Filled 2023-01-10: qty 473, 15d supply, fill #0

## 2023-01-10 MED ORDER — OXYCODONE HCL 15 MG PO TABS
15.0000 mg | ORAL_TABLET | Freq: Four times a day (QID) | ORAL | 0 refills | Status: AC
  Filled 2023-01-10: qty 120, 30d supply, fill #0

## 2023-01-11 ENCOUNTER — Other Ambulatory Visit (HOSPITAL_COMMUNITY): Payer: Self-pay

## 2023-01-19 ENCOUNTER — Institutional Professional Consult (permissible substitution) (HOSPITAL_BASED_OUTPATIENT_CLINIC_OR_DEPARTMENT_OTHER): Payer: No Typology Code available for payment source | Admitting: Pulmonary Disease

## 2023-01-26 ENCOUNTER — Other Ambulatory Visit: Payer: Self-pay

## 2023-01-26 ENCOUNTER — Other Ambulatory Visit (HOSPITAL_COMMUNITY): Payer: Self-pay

## 2023-01-27 ENCOUNTER — Other Ambulatory Visit (HOSPITAL_COMMUNITY): Payer: Self-pay

## 2023-02-03 ENCOUNTER — Other Ambulatory Visit (HOSPITAL_COMMUNITY): Payer: Self-pay

## 2023-02-03 MED ORDER — OXYCODONE HCL 15 MG PO TABS
15.0000 mg | ORAL_TABLET | Freq: Four times a day (QID) | ORAL | 0 refills | Status: DC
Start: 2023-02-06 — End: 2023-03-07
  Filled 2023-02-07: qty 120, 30d supply, fill #0

## 2023-02-03 MED ORDER — ALBUTEROL SULFATE HFA 108 (90 BASE) MCG/ACT IN AERS
1.0000 | INHALATION_SPRAY | RESPIRATORY_TRACT | 3 refills | Status: AC | PRN
  Filled 2023-04-06: qty 6.7, 30d supply, fill #0

## 2023-02-07 ENCOUNTER — Other Ambulatory Visit (HOSPITAL_COMMUNITY): Payer: Self-pay

## 2023-02-07 ENCOUNTER — Other Ambulatory Visit: Payer: Self-pay

## 2023-02-16 ENCOUNTER — Other Ambulatory Visit (HOSPITAL_COMMUNITY): Payer: Self-pay

## 2023-02-17 ENCOUNTER — Other Ambulatory Visit (HOSPITAL_COMMUNITY): Payer: Self-pay

## 2023-03-07 ENCOUNTER — Other Ambulatory Visit (HOSPITAL_COMMUNITY): Payer: Self-pay

## 2023-03-07 MED ORDER — PREDNISONE 5 MG PO TABS
5.0000 mg | ORAL_TABLET | Freq: Every day | ORAL | 5 refills | Status: AC
  Filled 2023-03-07: qty 30, 30d supply, fill #0
  Filled 2023-04-06 – 2023-05-04 (×2): qty 30, 30d supply, fill #1
  Filled 2023-06-02: qty 30, 30d supply, fill #2

## 2023-03-07 MED ORDER — OXYCODONE HCL 15 MG PO TABS
15.0000 mg | ORAL_TABLET | Freq: Four times a day (QID) | ORAL | 0 refills | Status: DC
Start: 2023-03-07 — End: 2023-04-04
  Filled 2023-03-07: qty 120, 30d supply, fill #0

## 2023-03-07 MED ORDER — NALOXONE HCL 4 MG/0.1ML NA LIQD
1.0000 | Freq: Once | NASAL | 0 refills | Status: AC
  Filled 2023-03-07: qty 1, 250d supply, fill #0

## 2023-03-08 ENCOUNTER — Other Ambulatory Visit (HOSPITAL_COMMUNITY): Payer: Self-pay

## 2023-04-04 ENCOUNTER — Other Ambulatory Visit (HOSPITAL_COMMUNITY): Payer: Self-pay

## 2023-04-04 ENCOUNTER — Institutional Professional Consult (permissible substitution) (HOSPITAL_BASED_OUTPATIENT_CLINIC_OR_DEPARTMENT_OTHER): Payer: No Typology Code available for payment source | Admitting: Pulmonary Disease

## 2023-04-04 ENCOUNTER — Other Ambulatory Visit: Payer: Self-pay

## 2023-04-04 MED ORDER — METFORMIN HCL 1000 MG PO TABS
500.0000 mg | ORAL_TABLET | Freq: Two times a day (BID) | ORAL | 6 refills | Status: AC
Start: 2023-04-04 — End: ?
  Filled 2023-04-04: qty 60, 60d supply, fill #0

## 2023-04-04 MED ORDER — METOPROLOL TARTRATE 50 MG PO TABS
100.0000 mg | ORAL_TABLET | Freq: Two times a day (BID) | ORAL | 5 refills | Status: AC
  Filled 2023-04-04: qty 60, 15d supply, fill #0

## 2023-04-04 MED ORDER — OXYCODONE HCL 15 MG PO TABS
15.0000 mg | ORAL_TABLET | Freq: Four times a day (QID) | ORAL | 0 refills | Status: DC
  Filled 2023-04-04 – 2023-04-06 (×3): qty 120, 30d supply, fill #0

## 2023-04-04 MED ORDER — AMLODIPINE BESYLATE 10 MG PO TABS
10.0000 mg | ORAL_TABLET | Freq: Every day | ORAL | 5 refills | Status: AC
Start: 2023-04-04 — End: ?
  Filled 2023-04-04: qty 30, 30d supply, fill #0
  Filled 2023-08-09: qty 30, 30d supply, fill #1

## 2023-04-06 ENCOUNTER — Other Ambulatory Visit (HOSPITAL_COMMUNITY): Payer: Self-pay

## 2023-04-06 ENCOUNTER — Other Ambulatory Visit: Payer: Self-pay

## 2023-04-07 ENCOUNTER — Other Ambulatory Visit (HOSPITAL_COMMUNITY): Payer: Self-pay

## 2023-04-17 ENCOUNTER — Other Ambulatory Visit: Payer: Self-pay | Admitting: Specialist

## 2023-04-17 DIAGNOSIS — Z1231 Encounter for screening mammogram for malignant neoplasm of breast: Secondary | ICD-10-CM

## 2023-04-20 ENCOUNTER — Other Ambulatory Visit (HOSPITAL_COMMUNITY): Payer: Self-pay

## 2023-05-02 ENCOUNTER — Other Ambulatory Visit: Payer: Self-pay

## 2023-05-02 ENCOUNTER — Other Ambulatory Visit (HOSPITAL_COMMUNITY): Payer: Self-pay

## 2023-05-02 MED ORDER — OXYCODONE HCL 15 MG PO TABS
15.0000 mg | ORAL_TABLET | Freq: Four times a day (QID) | ORAL | 0 refills | Status: DC
  Filled 2023-05-02 – 2023-05-04 (×2): qty 120, 30d supply, fill #0

## 2023-05-02 MED ORDER — VALSARTAN-HYDROCHLOROTHIAZIDE 320-25 MG PO TABS
1.0000 | ORAL_TABLET | Freq: Every day | ORAL | 5 refills | Status: AC
  Filled 2023-05-02: qty 30, 30d supply, fill #0

## 2023-05-02 MED ORDER — ALBUTEROL SULFATE HFA 108 (90 BASE) MCG/ACT IN AERS
INHALATION_SPRAY | RESPIRATORY_TRACT | 3 refills | Status: AC
  Filled 2023-05-02: qty 6.7, 33d supply, fill #0
  Filled 2023-06-02: qty 6.7, 33d supply, fill #1

## 2023-05-04 ENCOUNTER — Other Ambulatory Visit (HOSPITAL_COMMUNITY): Payer: Self-pay

## 2023-05-08 ENCOUNTER — Ambulatory Visit

## 2023-05-30 ENCOUNTER — Other Ambulatory Visit: Payer: Self-pay

## 2023-05-30 ENCOUNTER — Other Ambulatory Visit (HOSPITAL_COMMUNITY): Payer: Self-pay

## 2023-05-30 MED ORDER — OXYCODONE HCL 15 MG PO TABS
ORAL_TABLET | ORAL | 0 refills | Status: DC
  Filled 2023-05-30 – 2023-06-02 (×2): qty 120, 30d supply, fill #0

## 2023-06-02 ENCOUNTER — Other Ambulatory Visit (HOSPITAL_COMMUNITY): Payer: Self-pay

## 2023-06-07 ENCOUNTER — Other Ambulatory Visit: Payer: Self-pay | Admitting: Obstetrics and Gynecology

## 2023-06-07 DIAGNOSIS — Z1231 Encounter for screening mammogram for malignant neoplasm of breast: Secondary | ICD-10-CM

## 2023-06-27 ENCOUNTER — Other Ambulatory Visit (HOSPITAL_COMMUNITY): Payer: Self-pay

## 2023-06-27 MED ORDER — PREDNISONE 5 MG PO TABS
5.0000 mg | ORAL_TABLET | Freq: Every day | ORAL | 5 refills | Status: AC
  Filled 2023-06-27: qty 30, 30d supply, fill #0
  Filled 2023-09-26: qty 30, 30d supply, fill #1

## 2023-06-29 ENCOUNTER — Other Ambulatory Visit (HOSPITAL_COMMUNITY): Payer: Self-pay

## 2023-06-30 ENCOUNTER — Other Ambulatory Visit (HOSPITAL_COMMUNITY): Payer: Self-pay

## 2023-06-30 MED ORDER — OXYCODONE HCL 15 MG PO TABS
15.0000 mg | ORAL_TABLET | Freq: Four times a day (QID) | ORAL | 0 refills | Status: AC
Start: 2023-06-30 — End: ?
  Filled 2023-06-30: qty 120, 30d supply, fill #0

## 2023-07-25 ENCOUNTER — Other Ambulatory Visit (HOSPITAL_COMMUNITY): Payer: Self-pay

## 2023-07-25 MED ORDER — OZEMPIC (0.25 OR 0.5 MG/DOSE) 2 MG/3ML ~~LOC~~ SOPN
PEN_INJECTOR | SUBCUTANEOUS | 0 refills | Status: DC
  Filled 2023-07-25: qty 3, 28d supply, fill #0

## 2023-08-01 ENCOUNTER — Other Ambulatory Visit (HOSPITAL_COMMUNITY): Payer: Self-pay

## 2023-08-01 MED ORDER — OXYCODONE HCL 15 MG PO TABS
15.0000 mg | ORAL_TABLET | Freq: Four times a day (QID) | ORAL | 0 refills | Status: DC
  Filled 2023-08-01: qty 120, 30d supply, fill #0

## 2023-08-01 MED ORDER — LANTUS SOLOSTAR 100 UNIT/ML ~~LOC~~ SOPN
12.0000 [IU] | PEN_INJECTOR | Freq: Every day | SUBCUTANEOUS | 3 refills | Status: AC
Start: 2023-08-01 — End: ?
  Filled 2023-08-01: qty 3, 25d supply, fill #0
  Filled 2023-09-14 – 2023-10-24 (×2): qty 3, 25d supply, fill #1

## 2023-08-01 MED ORDER — ROSUVASTATIN CALCIUM 5 MG PO TABS
5.0000 mg | ORAL_TABLET | Freq: Every day | ORAL | 5 refills | Status: AC
  Filled 2023-08-01: qty 30, 30d supply, fill #0

## 2023-08-09 ENCOUNTER — Other Ambulatory Visit: Payer: Self-pay

## 2023-08-17 ENCOUNTER — Inpatient Hospital Stay: Admission: RE | Admit: 2023-08-17 | Source: Ambulatory Visit

## 2023-08-17 ENCOUNTER — Ambulatory Visit: Payer: Self-pay

## 2023-08-30 ENCOUNTER — Telehealth: Payer: Self-pay

## 2023-08-30 ENCOUNTER — Other Ambulatory Visit (HOSPITAL_COMMUNITY): Payer: Self-pay

## 2023-08-30 MED ORDER — OXYCODONE HCL 15 MG PO TABS
15.0000 mg | ORAL_TABLET | Freq: Four times a day (QID) | ORAL | 0 refills | Status: AC
  Filled 2023-08-30: qty 120, 30d supply, fill #0

## 2023-08-30 NOTE — Telephone Encounter (Signed)
 Telephoned patient at mobile number. Left a voice message with [facility] scheduling contact information.

## 2023-09-15 ENCOUNTER — Other Ambulatory Visit: Payer: Self-pay

## 2023-09-15 ENCOUNTER — Other Ambulatory Visit (HOSPITAL_COMMUNITY): Payer: Self-pay

## 2023-09-15 ENCOUNTER — Other Ambulatory Visit (HOSPITAL_BASED_OUTPATIENT_CLINIC_OR_DEPARTMENT_OTHER): Payer: Self-pay

## 2023-09-26 ENCOUNTER — Other Ambulatory Visit: Payer: Self-pay

## 2023-09-26 ENCOUNTER — Other Ambulatory Visit (HOSPITAL_COMMUNITY): Payer: Self-pay

## 2023-09-26 MED ORDER — METFORMIN HCL 1000 MG PO TABS
500.0000 mg | ORAL_TABLET | Freq: Two times a day (BID) | ORAL | 6 refills | Status: AC
  Filled 2023-09-26: qty 60, 60d supply, fill #0

## 2023-09-26 MED ORDER — OXYCODONE HCL 15 MG PO TABS
15.0000 mg | ORAL_TABLET | Freq: Four times a day (QID) | ORAL | 0 refills | Status: DC
  Filled 2023-09-27: qty 120, 30d supply, fill #0

## 2023-09-26 MED ORDER — ALBUTEROL SULFATE HFA 108 (90 BASE) MCG/ACT IN AERS
1.0000 | INHALATION_SPRAY | RESPIRATORY_TRACT | 3 refills | Status: AC | PRN
  Filled 2023-09-26: qty 6.7, 32d supply, fill #0

## 2023-09-26 MED ORDER — AMLODIPINE BESYLATE 10 MG PO TABS
10.0000 mg | ORAL_TABLET | Freq: Every day | ORAL | 5 refills | Status: AC
  Filled 2023-09-26: qty 30, 30d supply, fill #0

## 2023-09-26 MED ORDER — VALSARTAN-HYDROCHLOROTHIAZIDE 320-25 MG PO TABS
1.0000 | ORAL_TABLET | Freq: Every day | ORAL | 5 refills | Status: AC
  Filled 2023-09-26: qty 30, 30d supply, fill #0

## 2023-09-27 ENCOUNTER — Other Ambulatory Visit (HOSPITAL_COMMUNITY): Payer: Self-pay

## 2023-10-24 ENCOUNTER — Other Ambulatory Visit (HOSPITAL_COMMUNITY): Payer: Self-pay

## 2023-10-24 MED ORDER — NYSTATIN 100000 UNIT/GM EX CREA
1.0000 | TOPICAL_CREAM | Freq: Two times a day (BID) | CUTANEOUS | 1 refills | Status: AC
  Filled 2023-10-24: qty 30, 15d supply, fill #0

## 2023-10-24 MED ORDER — OXYCODONE HCL 15 MG PO TABS
15.0000 mg | ORAL_TABLET | Freq: Four times a day (QID) | ORAL | 0 refills | Status: DC
  Filled 2023-10-25: qty 120, 30d supply, fill #0

## 2023-10-25 ENCOUNTER — Other Ambulatory Visit: Payer: Self-pay

## 2023-10-25 ENCOUNTER — Other Ambulatory Visit (HOSPITAL_COMMUNITY): Payer: Self-pay

## 2023-11-02 ENCOUNTER — Ambulatory Visit: Payer: Self-pay

## 2023-11-02 ENCOUNTER — Ambulatory Visit
Admission: RE | Admit: 2023-11-02 | Discharge: 2023-11-02 | Disposition: A | Discharge status: Home / Self Care | Source: Ambulatory Visit | Attending: Obstetrics and Gynecology | Admitting: Obstetrics and Gynecology

## 2023-11-02 DIAGNOSIS — Z1231 Encounter for screening mammogram for malignant neoplasm of breast: Secondary | ICD-10-CM

## 2023-11-21 ENCOUNTER — Other Ambulatory Visit (HOSPITAL_COMMUNITY): Payer: Self-pay

## 2023-11-21 MED ORDER — PREDNISONE 5 MG PO TABS
5.0000 mg | ORAL_TABLET | Freq: Every day | ORAL | 5 refills | Status: AC
  Filled 2023-11-21: qty 30, 30d supply, fill #0

## 2023-11-21 MED ORDER — OXYCODONE HCL 15 MG PO TABS
15.0000 mg | ORAL_TABLET | Freq: Four times a day (QID) | ORAL | 0 refills | Status: DC
  Filled 2023-11-21: qty 120, 30d supply, fill #0

## 2023-11-21 MED ORDER — LANTUS SOLOSTAR 100 UNIT/ML ~~LOC~~ SOPN
12.0000 [IU] | PEN_INJECTOR | Freq: Every day | SUBCUTANEOUS | 3 refills | Status: AC
  Filled 2023-11-21: qty 3, 25d supply, fill #0
  Filled 2024-02-19: qty 3, 25d supply, fill #1

## 2023-11-21 MED ORDER — METOPROLOL TARTRATE 50 MG PO TABS
100.0000 mg | ORAL_TABLET | Freq: Two times a day (BID) | ORAL | 5 refills | Status: AC
  Filled 2023-11-21: qty 60, 15d supply, fill #0

## 2023-12-19 ENCOUNTER — Other Ambulatory Visit (HOSPITAL_COMMUNITY): Payer: Self-pay

## 2023-12-19 MED ORDER — OXYCODONE HCL 15 MG PO TABS
ORAL_TABLET | ORAL | 0 refills | Status: DC
  Filled 2023-12-19: qty 120, 30d supply, fill #0

## 2024-01-16 ENCOUNTER — Other Ambulatory Visit: Payer: Self-pay

## 2024-01-16 ENCOUNTER — Other Ambulatory Visit (HOSPITAL_COMMUNITY): Payer: Self-pay

## 2024-01-16 MED ORDER — OXYCODONE HCL 15 MG PO TABS
ORAL_TABLET | ORAL | 0 refills | Status: DC
  Filled 2024-01-16: qty 120, 30d supply, fill #0

## 2024-01-16 MED ORDER — ROSUVASTATIN CALCIUM 5 MG PO TABS
5.0000 mg | ORAL_TABLET | Freq: Every day | ORAL | 5 refills | Status: AC
  Filled 2024-01-16: qty 30, 30d supply, fill #0
  Filled 2024-02-19: qty 30, 30d supply, fill #1

## 2024-01-16 MED ORDER — ALBUTEROL SULFATE HFA 108 (90 BASE) MCG/ACT IN AERS
INHALATION_SPRAY | RESPIRATORY_TRACT | 3 refills | Status: AC
  Filled 2024-01-16: qty 6.7, 34d supply, fill #0

## 2024-02-13 ENCOUNTER — Other Ambulatory Visit (HOSPITAL_COMMUNITY): Payer: Self-pay

## 2024-02-13 MED ORDER — OXYCODONE HCL 15 MG PO TABS
15.0000 mg | ORAL_TABLET | Freq: Four times a day (QID) | ORAL | 0 refills | Status: AC
  Filled 2024-02-13: qty 120, 30d supply, fill #0

## 2024-02-13 MED ORDER — MUPIROCIN 2 % EX OINT
1.0000 | TOPICAL_OINTMENT | Freq: Two times a day (BID) | CUTANEOUS | 0 refills | Status: AC
  Filled 2024-02-13: qty 22, 11d supply, fill #0

## 2024-02-13 MED ORDER — PREDNISONE 5 MG PO TABS
ORAL_TABLET | ORAL | 5 refills | Status: AC
  Filled 2024-02-13: qty 30, 30d supply, fill #0

## 2024-02-13 MED ORDER — CEPHALEXIN 500 MG PO CAPS
500.0000 mg | ORAL_CAPSULE | Freq: Three times a day (TID) | ORAL | 0 refills | Status: AC
  Filled 2024-02-13: qty 21, 7d supply, fill #0

## 2024-02-14 ENCOUNTER — Other Ambulatory Visit (HOSPITAL_COMMUNITY): Payer: Self-pay

## 2024-02-19 ENCOUNTER — Other Ambulatory Visit (HOSPITAL_COMMUNITY): Payer: Self-pay

## 2024-03-12 ENCOUNTER — Other Ambulatory Visit (HOSPITAL_COMMUNITY): Payer: Self-pay

## 2024-03-12 MED ORDER — OXYCODONE HCL 15 MG PO TABS
15.0000 mg | ORAL_TABLET | Freq: Four times a day (QID) | ORAL | 0 refills | Status: AC
  Filled 2024-03-13: qty 120, 30d supply, fill #0

## 2024-03-13 ENCOUNTER — Other Ambulatory Visit: Payer: Self-pay

## 2024-03-13 ENCOUNTER — Other Ambulatory Visit (HOSPITAL_COMMUNITY): Payer: Self-pay

## 2024-03-13 MED ORDER — AMLODIPINE BESYLATE 10 MG PO TABS
10.0000 mg | ORAL_TABLET | Freq: Every day | ORAL | 5 refills | Status: AC
  Filled 2024-03-13: qty 30, 30d supply, fill #0

## 2024-03-13 MED ORDER — VALSARTAN-HYDROCHLOROTHIAZIDE 320-25 MG PO TABS
1.0000 | ORAL_TABLET | Freq: Every day | ORAL | 5 refills | Status: AC
  Filled 2024-03-13: qty 30, 30d supply, fill #0
